# Patient Record
Sex: Female | Born: 1975 | Race: Black or African American | Hispanic: No | Smoking: Current every day smoker
Health system: Southern US, Community
[De-identification: ages and names within clinical notes are randomized; demographics above are authoritative.]

## PROBLEM LIST (undated history)

## (undated) DIAGNOSIS — N83519 Torsion of ovary and ovarian pedicle, unspecified side: Secondary | ICD-10-CM

## (undated) DIAGNOSIS — K5792 Diverticulitis of intestine, part unspecified, without perforation or abscess without bleeding: Secondary | ICD-10-CM

## (undated) HISTORY — PX: OVARIAN CYST REMOVAL: SHX89

---

## 2018-06-03 ENCOUNTER — Emergency Department (HOSPITAL_COMMUNITY)
Admission: EM | Admit: 2018-06-03 | Discharge: 2018-06-03 | Disposition: A | Discharge status: Home / Self Care | Payer: Self-pay | Attending: Emergency Medicine | Admitting: Emergency Medicine

## 2018-06-03 ENCOUNTER — Other Ambulatory Visit: Payer: Self-pay

## 2018-06-03 DIAGNOSIS — K0889 Other specified disorders of teeth and supporting structures: Secondary | ICD-10-CM | POA: Insufficient documentation

## 2018-06-03 MED ORDER — PENICILLIN V POTASSIUM 500 MG PO TABS: 500.0000 mg | ORAL_TABLET | Freq: Four times a day (QID) | ORAL | 0 refills | Status: AC

## 2018-06-03 NOTE — Discharge Instructions (Addendum)
Please read attached information. If you experience any new or worsening signs or symptoms please return to the emergency room for evaluation. Please follow-up with your primary care provider or specialist as discussed. Please use medication prescribed only as directed and discontinue taking if you have any concerning signs or symptoms.   °

## 2018-06-03 NOTE — ED Notes (Signed)
Patient verbalizes understanding of discharge instructions . Opportunity for questions and answers were provided . Armband removed by staff ,Pt discharged from ED. W/C  offered at D/C  and Declined W/C at D/C and was escorted to lobby by RN.  

## 2018-06-03 NOTE — ED Provider Notes (Signed)
[facility] EMERGENCY DEPARTMENT Provider Note   CSN: [id number] Arrival date & time: 06/03/18  1245    History   Chief Complaint Chief Complaint  Patient presents with  . Facial Swelling    HPI Carla Scott is a 43 y.o. female.     HPI   43 year old female presents today with several days of right-sided facial pain.  Patient notes symptoms started over a year ago and have been worsening.  She notes over the last several days subjective swelling.  She cannot tell if this is coming from the top of the bottom tooth.  She denies any fever.  She notes she is new to the area and does not have a dentist.  No past medical history on file.  There are no active problems to display for this patient.    The histories are not reviewed yet. Please review them in the "History" navigator section and refresh this [place].   OB History   No obstetric history on file.      Home Medications    Prior to Admission medications   Medication Sig Start Date End Date Taking? Authorizing Provider  penicillin v potassium (VEETID) 500 MG tablet Take 1 tablet (500 mg total) by mouth 4 (four) times daily for 7 days. 06/03/18 06/10/18  Okey Regal, PA-C    Family History No family history on file.  Social History Social History   Tobacco Use  . Smoking status: Not on file  Substance Use Topics  . Alcohol use: Not on file  . Drug use: Not on file     Allergies   Patient has no known allergies.   Review of Systems Review of Systems  All other systems reviewed and are negative.   Physical Exam Updated Vital Signs BP (!) 166/101 (BP Location: Left Arm)   Pulse 77   Temp 98.4 F (36.9 C) (Oral)   Resp 18   LMP 05/24/2018 (Exact Date)   SpO2 100%   Physical Exam Vitals signs and nursing note reviewed.  Constitutional:      Appearance: She is well-developed.  HENT:     Head: Normocephalic and atraumatic.     Comments: Face is symmetrical no swelling  or edema-gumline palpated with minor tenderness along the lateral aspect, no swelling or fluctuance, no lymphadenopathy uvula midline rises with phonation Eyes:     General: No scleral icterus.       Right eye: No discharge.        Left eye: No discharge.     Conjunctiva/sclera: Conjunctivae normal.     Pupils: Pupils are equal, round, and reactive to light.  Neck:     Musculoskeletal: Normal range of motion.     Vascular: No JVD.     Trachea: No tracheal deviation.  Pulmonary:     Effort: Pulmonary effort is normal.     Breath sounds: No stridor.  Neurological:     Mental Status: She is alert and oriented to person, place, and time.     Coordination: Coordination normal.  Psychiatric:        Behavior: Behavior normal.        Thought Content: Thought content normal.        Judgment: Judgment normal.      ED Treatments / Results  Labs (all labs ordered are listed, but only abnormal results are displayed) Labs Reviewed - No data to display  EKG None  Radiology No results found.  Procedures Procedures (including critical care  time)  Medications Ordered in ED Medications - No data to display   Initial Impression / Assessment and Plan / ED Course  I have reviewed the triage vital signs and the nursing notes.  Pertinent labs & imaging results that were available during my care of the patient were reviewed by me and considered in my medical decision making (see chart for details).        43 year old female presents today with dental pain.  She has no signs of objective swelling.  Patient will be for presumed infection.  Discharged with outpatient follow-up and return precautions.  She verbalized understanding and agreement to today's plan.  Final Clinical Impressions(s) / ED Diagnoses   Final diagnoses:  Pain, dental    ED Discharge Orders         Ordered    penicillin v potassium (VEETID) 500 MG tablet  4 times daily     06/03/18 1337           Okey Regal, PA-C 06/03/18 1515    Drenda Freeze, MD 06/03/18 [phone]

## 2019-11-27 ENCOUNTER — Other Ambulatory Visit: Payer: Self-pay

## 2019-11-27 ENCOUNTER — Encounter (HOSPITAL_COMMUNITY): Payer: Self-pay | Admitting: *Deleted

## 2019-11-27 ENCOUNTER — Emergency Department (HOSPITAL_COMMUNITY)
Admission: EM | Admit: 2019-11-27 | Discharge: 2019-11-27 | Disposition: A | Discharge status: Home / Self Care | Payer: Self-pay | Attending: Emergency Medicine | Admitting: Emergency Medicine

## 2019-11-27 ENCOUNTER — Emergency Department (HOSPITAL_COMMUNITY): Payer: Self-pay

## 2019-11-27 DIAGNOSIS — Z5321 Procedure and treatment not carried out due to patient leaving prior to being seen by health care provider: Secondary | ICD-10-CM | POA: Insufficient documentation

## 2019-11-27 DIAGNOSIS — R110 Nausea: Secondary | ICD-10-CM | POA: Insufficient documentation

## 2019-11-27 DIAGNOSIS — R079 Chest pain, unspecified: Secondary | ICD-10-CM | POA: Insufficient documentation

## 2019-11-27 LAB — CBC
HCT: 39.3 % (ref 36.0–46.0)
Hemoglobin: 12.5 g/dL (ref 12.0–15.0)
MCH: 30.6 pg (ref 26.0–34.0)
MCHC: 31.8 g/dL (ref 30.0–36.0)
MCV: 96.3 fL (ref 80.0–100.0)
Platelets: 202 10*3/uL (ref 150–400)
RBC: 4.08 MIL/uL (ref 3.87–5.11)
RDW: 13.4 % (ref 11.5–15.5)
WBC: 9.2 10*3/uL (ref 4.0–10.5)
nRBC: 0 % (ref 0.0–0.2)

## 2019-11-27 LAB — I-STAT BETA HCG BLOOD, ED (MC, WL, AP ONLY): I-stat hCG, quantitative: <5 m[IU]/mL (ref <5)

## 2019-11-27 LAB — BASIC METABOLIC PANEL
Anion gap: 6 (ref 5–15)
BUN: 12 mg/dL (ref 6–20)
CO2: 25 mmol/L (ref 22–32)
Calcium: 9.3 mg/dL (ref 8.9–10.3)
Chloride: 105 mmol/L (ref 98–111)
Creatinine, Ser: 1.08 mg/dL — ABNORMAL HIGH (ref 0.44–1.00)
GFR calc Af Amer: >60 mL/min (ref >60)
GFR calc non Af Amer: >60 mL/min (ref >60)
Glucose, Bld: 110 mg/dL — ABNORMAL HIGH (ref 70–99)
Potassium: 3.6 mmol/L (ref 3.5–5.1)
Sodium: 136 mmol/L (ref 135–145)

## 2019-11-27 LAB — TROPONIN I (HIGH SENSITIVITY): Troponin I (High Sensitivity): 8 ng/L (ref <18)

## 2019-11-27 NOTE — ED Triage Notes (Signed)
Pt arrived by [facility] for chest pain. Had initial episode at 0630 which resolved. Then had 2nd episode which lasted longer. Per ems, pt was nauseated and appeared anxious, hx of anxiety. Pt received nitro, ASA and zofran pta and pain relieved pta.

## 2019-11-27 NOTE — ED Notes (Signed)
Pt states since she feels better she is going to leave. She stated a plan to follow up with her primary care physician when she can get an appointment. This NT removed the patient's IV, wristband, and stickers. Pt walked outside to call her ride.

## 2020-07-21 ENCOUNTER — Emergency Department (HOSPITAL_COMMUNITY): Payer: Self-pay

## 2020-07-21 ENCOUNTER — Emergency Department (HOSPITAL_COMMUNITY)
Admission: EM | Admit: 2020-07-21 | Discharge: 2020-07-21 | Disposition: A | Discharge status: Home / Self Care | Payer: Self-pay | Attending: Emergency Medicine | Admitting: Emergency Medicine

## 2020-07-21 ENCOUNTER — Other Ambulatory Visit: Payer: Self-pay

## 2020-07-21 DIAGNOSIS — G245 Blepharospasm: Secondary | ICD-10-CM | POA: Insufficient documentation

## 2020-07-21 DIAGNOSIS — H539 Unspecified visual disturbance: Secondary | ICD-10-CM

## 2020-07-21 DIAGNOSIS — H5462 Unqualified visual loss, left eye, normal vision right eye: Secondary | ICD-10-CM | POA: Insufficient documentation

## 2020-07-21 DIAGNOSIS — R519 Headache, unspecified: Secondary | ICD-10-CM | POA: Insufficient documentation

## 2020-07-21 LAB — BASIC METABOLIC PANEL
Anion gap: 9 (ref 5–15)
BUN: 8 mg/dL (ref 6–20)
CO2: 25 mmol/L (ref 22–32)
Calcium: 9.3 mg/dL (ref 8.9–10.3)
Chloride: 102 mmol/L (ref 98–111)
Creatinine, Ser: 1.04 mg/dL — ABNORMAL HIGH (ref 0.44–1.00)
GFR, Estimated: >60 mL/min (ref >60)
Glucose, Bld: 94 mg/dL (ref 70–99)
Potassium: 3.9 mmol/L (ref 3.5–5.1)
Sodium: 136 mmol/L (ref 135–145)

## 2020-07-21 LAB — CBC WITH DIFFERENTIAL/PLATELET
Abs Immature Granulocytes: 0.07 10*3/uL (ref 0.00–0.07)
Basophils Absolute: 0.1 10*3/uL (ref 0.0–0.1)
Basophils Relative: 1 %
Eosinophils Absolute: 0.1 10*3/uL (ref 0.0–0.5)
Eosinophils Relative: 1 %
HCT: 39 % (ref 36.0–46.0)
Hemoglobin: 12.7 g/dL (ref 12.0–15.0)
Immature Granulocytes: 1 %
Lymphocytes Relative: 32 %
Lymphs Abs: 3.9 10*3/uL (ref 0.7–4.0)
MCH: 30.8 pg (ref 26.0–34.0)
MCHC: 32.6 g/dL (ref 30.0–36.0)
MCV: 94.7 fL (ref 80.0–100.0)
Monocytes Absolute: 0.7 10*3/uL (ref 0.1–1.0)
Monocytes Relative: 5 %
Neutro Abs: 7.4 10*3/uL (ref 1.7–7.7)
Neutrophils Relative %: 60 %
Platelets: 225 10*3/uL (ref 150–400)
RBC: 4.12 MIL/uL (ref 3.87–5.11)
RDW: 13.2 % (ref 11.5–15.5)
WBC: 12.2 10*3/uL — ABNORMAL HIGH (ref 4.0–10.5)
nRBC: 0 % (ref 0.0–0.2)

## 2020-07-21 LAB — SEDIMENTATION RATE: Sed Rate: 5 mm/hr (ref 0–22)

## 2020-07-21 MED ORDER — DEXAMETHASONE SODIUM PHOSPHATE 10 MG/ML IJ SOLN
10.0000 mg | Freq: Once | INTRAMUSCULAR | Status: AC
  Administered 2020-07-21: 10 mg via INTRAVENOUS
  Filled 2020-07-21: qty 1

## 2020-07-21 MED ORDER — GADOBUTROL 1 MMOL/ML IV SOLN
9.0000 mL | Freq: Once | INTRAVENOUS | Status: AC | PRN
  Administered 2020-07-21: 9 mL via INTRAVENOUS

## 2020-07-21 MED ORDER — HYDROCHLOROTHIAZIDE 25 MG PO TABS: 25.0000 mg | ORAL_TABLET | Freq: Every day | ORAL | 0 refills | Status: DC

## 2020-07-21 MED ORDER — PROCHLORPERAZINE EDISYLATE 10 MG/2ML IJ SOLN
10.0000 mg | Freq: Once | INTRAMUSCULAR | Status: AC
  Administered 2020-07-21: 10 mg via INTRAVENOUS
  Filled 2020-07-21: qty 2

## 2020-07-21 MED ORDER — SODIUM CHLORIDE 0.9 % IV BOLUS: 1000.0000 mL | Freq: Once | INTRAVENOUS | Status: DC

## 2020-07-21 MED ORDER — KETOROLAC TROMETHAMINE 15 MG/ML IJ SOLN
15.0000 mg | Freq: Once | INTRAMUSCULAR | Status: AC
  Administered 2020-07-21: 15 mg via INTRAVENOUS
  Filled 2020-07-21: qty 1

## 2020-07-21 NOTE — ED Triage Notes (Signed)
Pt here from home with c/o headache  Times 8 days , pt has been out of her meds b/p meds and Zoloft , negative Covid test 5 days ago

## 2020-07-21 NOTE — Discharge Instructions (Signed)
As discussed, your evaluation today has been largely reassuring.  But, it is important that you monitor your condition carefully, and do not hesitate to return to the ED if you develop new, or concerning changes in your condition. ? ?Otherwise, please follow-up with your physician for appropriate ongoing care. ? ?

## 2020-07-21 NOTE — ED Triage Notes (Signed)
Emergency Medicine Provider Triage Evaluation Note  Carla Scott , a 45 y.o. female  was evaluated in triage.  Pt complains of headache.  Began 1 week ago.  Was initially located right side of her head.  Patient states her right side of her head pain has resolved and is now again left pain.  Goes from the posterior aspect of her head by her temporal region and over her left eye.  She denies any vision changes, paresthesias, weakness, head trauma.  Does have history of migraines.  Her migraines previously have been in today.  Review of Systems  Positive: Headache Negative: Vision changes, weakness, numbness  Physical Exam  BP (!) 147/97 (BP Location: Left Arm)   Pulse 89   Temp 98.9 F (37.2 C) (Oral)   Resp 14   SpO2 100%  Gen:   Awake, no distress   HEENT:  Atraumatic  Resp:  Normal effort  Cardiac:  Normal rate  Abd:   Nondistended, nontender  MSK:   Moves extremities without difficulty  Neuro:  Speech clear, no facial droop, equal strength bilaterally, intact sensation  Medical Decision Making  Medically screening exam initiated at 7:54 AM.  Appropriate orders placed.  Clela Katayama was informed that the remainder of the evaluation will be completed by another provider, this initial triage assessment does not replace that evaluation, and the importance of remaining in the ED until their evaluation is complete.  Clinical Impression  Stable  Headache   Karalyne Nusser A, PA-C 07/21/20 0755

## 2020-07-21 NOTE — ED Provider Notes (Signed)
[facility] EMERGENCY DEPARTMENT Provider Note   CSN: [id number] Arrival date & time: 07/21/20  7829     History No chief complaint on file.   Carla Scott is a 45 y.o. female.  HPI Patient presents with her headache, intermittent vision loss. She does have a history of migraines, but notes that today's headache is unusual for her.  This is actually been present for about 8 days.  Pain began initially on the right side, but is subsequently moved to the left side and is now left parietal, left occipital and less so less frontal.  Sore, severe, persistent. There is associated new left blepharospasm and intermittent left eye vision loss, though none of this is present currently. No weakness in any extremity, no vomiting. No relief with OTC medication.     No past medical history on file.  There are no problems to display for this patient.   No past surgical history on file.   OB History   No obstetric history on file.     No family history on file.     Home Medications Prior to Admission medications   Medication Sig Start Date End Date Taking? Authorizing Provider  hydrochlorothiazide (HYDRODIURIL) 25 MG tablet Take 1 tablet (25 mg total) by mouth daily. 07/21/20  Yes Carmin Muskrat, MD    Allergies    Patient has no known allergies.  Review of Systems   Review of Systems  Constitutional:       Per HPI, otherwise negative  HENT:       Per HPI, otherwise negative  Eyes: Positive for pain and visual disturbance.       Pain with lateral vision left eye only.  Respiratory:       Per HPI, otherwise negative  Cardiovascular:       Per HPI, otherwise negative  Gastrointestinal: Negative for vomiting.  Endocrine:       Negative aside from HPI  Genitourinary:       Neg aside from HPI   Musculoskeletal:       Per HPI, otherwise negative  Skin: Negative.   Neurological: Positive for headaches. Negative for syncope.    Physical  Exam Updated Vital Signs BP (!) 158/95   Pulse 83   Temp 98.9 F (37.2 C) (Oral)   Resp 18   SpO2 100%   Physical Exam Vitals and nursing note reviewed.  Constitutional:      General: She is not in acute distress.    Appearance: She is well-developed.  HENT:     Head: Normocephalic and atraumatic.  Eyes:     Conjunctiva/sclera: Conjunctivae normal.     Comments: Ongoing left eye blepharospasm.  Pain reported with left eye visual deviation.  Cardiovascular:     Rate and Rhythm: Normal rate and regular rhythm.  Pulmonary:     Effort: Pulmonary effort is normal. No respiratory distress.     Breath sounds: Normal breath sounds. No stridor.  Abdominal:     General: There is no distension.  Skin:    General: Skin is warm and dry.  Neurological:     Mental Status: She is alert and oriented to person, place, and time.     Cranial Nerves: No cranial nerve deficit.     Coordination: Coordination normal.     Comments: No visual acuity changes currently, no field cuts.  Face is symmetric, speech is clear.     ED Results / Procedures / Treatments   Labs (all labs  ordered are listed, but only abnormal results are displayed) Labs Reviewed  BASIC METABOLIC PANEL - Abnormal; Notable for the following components:      Result Value   Creatinine, Ser 1.04 (*)    All other components within normal limits  CBC WITH DIFFERENTIAL/PLATELET - Abnormal; Notable for the following components:   WBC 12.2 (*)    All other components within normal limits  SEDIMENTATION RATE    EKG None  Radiology MR Brain W and Wo Contrast  Result Date: 07/21/2020 CLINICAL DATA:  Left-sided headache and intermittent vision loss. Blepharospasm. EXAM: MRI HEAD WITHOUT AND WITH CONTRAST TECHNIQUE: Multiplanar, multiecho pulse sequences of the brain and surrounding structures were obtained without and with intravenous contrast. CONTRAST:  53mL GADAVIST GADOBUTROL 1 MMOL/ML IV SOLN COMPARISON:  None. FINDINGS:  Brain: There is no evidence of an acute infarct, intracranial hemorrhage, mass, midline shift, or extra-axial fluid collection. The ventricles and sulci are normal. There is a prominently enlarged partially empty sella. The cerebellar tonsils are normally positioned. There are 15-20 small foci of T2 FLAIR hyperintensity scattered in the subcortical, deep, and periventricular cerebral white matter bilaterally with the largest measuring 7 mm adjacent to the right frontal horn. Vascular: Major intracranial vascular flow voids are preserved. The major dural venous sinuses are enhancing. Skull and upper cervical spine: Focal nonspecific thickening of the parietal scalp soft tissues in the midline near the vertex. No enhancing scalp mass. No suspicious marrow lesion. Sinuses/Orbits: Unremarkable orbits. Large mucous retention cyst in the right maxillary sinus. Clear mastoid air cells. Other: None. IMPRESSION: 1. No acute intracranial abnormality. 2. Cerebral white matter T2 signal changes which are mild but advanced for age, nonspecific though may reflect premature chronic small vessel ischemia, migraines, or prior infection/inflammation. 3. Partially empty sella, often an incidental finding though can be seen with idiopathic intracranial hypertension. Electronically Signed   By: Logan Bores M.D.   On: 07/21/2020 12:55    Procedures Procedures   Medications Ordered in ED Medications  sodium chloride 0.9 % bolus 1,000 mL (has no administration in time range)  prochlorperazine (COMPAZINE) injection 10 mg (10 mg Intravenous Given 07/21/20 1200)  ketorolac (TORADOL) 15 MG/ML injection 15 mg (15 mg Intravenous Given 07/21/20 1159)  dexamethasone (DECADRON) injection 10 mg (10 mg Intravenous Given 07/21/20 1158)  gadobutrol (GADAVIST) 1 MMOL/ML injection 9 mL (9 mLs Intravenous Contrast Given 07/21/20 1234)    ED Course  I have reviewed the triage vital signs and the nursing notes.  Pertinent labs & imaging  results that were available during my care of the patient were reviewed by me and considered in my medical decision making (see chart for details).  1:28 PM On repeat exam the patient's headache is resolved.  I reviewed her labs, MRI, now discussed them with her. No evidence for intracranial mass, stroke though there are findings consistent with a long history of hypertension.  Patient is aware of this, need for medication compliance, and she will be provided a refill of her hydrochlorothiazide, referral to follow-up with our local clinic. With improved headache here, suspicion for atypical migraine contributing to her presentation.  Patient discharged in stable condition. Final Clinical Impression(s) / ED Diagnoses Final diagnoses:  Vision changes  Bad headache    Rx / DC Orders ED Discharge Orders         Ordered    hydrochlorothiazide (HYDRODIURIL) 25 MG tablet  Daily        07/21/20 1322  Carmin Muskrat, MD 07/21/20 1329

## 2020-07-21 NOTE — ED Notes (Signed)
Patient transported to MRI 

## 2020-11-21 ENCOUNTER — Emergency Department (HOSPITAL_BASED_OUTPATIENT_CLINIC_OR_DEPARTMENT_OTHER): Payer: Self-pay

## 2020-11-21 ENCOUNTER — Encounter (HOSPITAL_BASED_OUTPATIENT_CLINIC_OR_DEPARTMENT_OTHER): Payer: Self-pay | Admitting: Obstetrics and Gynecology

## 2020-11-21 ENCOUNTER — Inpatient Hospital Stay (HOSPITAL_BASED_OUTPATIENT_CLINIC_OR_DEPARTMENT_OTHER)
Admission: EM | Admit: 2020-11-21 | Discharge: 2020-11-24 | DRG: 392 | Disposition: A | Discharge status: Home / Self Care | Payer: Self-pay | Attending: Surgery | Admitting: Surgery

## 2020-11-21 ENCOUNTER — Other Ambulatory Visit: Payer: Self-pay

## 2020-11-21 DIAGNOSIS — K5720 Diverticulitis of large intestine with perforation and abscess without bleeding: Principal | ICD-10-CM | POA: Diagnosis present

## 2020-11-21 DIAGNOSIS — I10 Essential (primary) hypertension: Secondary | ICD-10-CM | POA: Diagnosis present

## 2020-11-21 DIAGNOSIS — E663 Overweight: Secondary | ICD-10-CM | POA: Diagnosis present

## 2020-11-21 DIAGNOSIS — N83202 Unspecified ovarian cyst, left side: Secondary | ICD-10-CM | POA: Diagnosis present

## 2020-11-21 DIAGNOSIS — F17210 Nicotine dependence, cigarettes, uncomplicated: Secondary | ICD-10-CM | POA: Diagnosis present

## 2020-11-21 DIAGNOSIS — K5732 Diverticulitis of large intestine without perforation or abscess without bleeding: Secondary | ICD-10-CM | POA: Diagnosis present

## 2020-11-21 DIAGNOSIS — Z20822 Contact with and (suspected) exposure to covid-19: Secondary | ICD-10-CM | POA: Diagnosis present

## 2020-11-21 DIAGNOSIS — Z79899 Other long term (current) drug therapy: Secondary | ICD-10-CM

## 2020-11-21 DIAGNOSIS — Z90721 Acquired absence of ovaries, unilateral: Secondary | ICD-10-CM

## 2020-11-21 HISTORY — DX: Torsion of ovary and ovarian pedicle, unspecified side: N83.519

## 2020-11-21 LAB — URINALYSIS, ROUTINE W REFLEX MICROSCOPIC
Bilirubin Urine: NEGATIVE
Glucose, UA: NEGATIVE mg/dL
Ketones, ur: 40 mg/dL — AB
Leukocytes,Ua: NEGATIVE
Nitrite: NEGATIVE
Specific Gravity, Urine: >1.046 — ABNORMAL HIGH (ref 1.005–1.030)
pH: 6 (ref 5.0–8.0)

## 2020-11-21 LAB — CBC
HCT: 37.1 % (ref 36.0–46.0)
Hemoglobin: 12.1 g/dL (ref 12.0–15.0)
MCH: 30.3 pg (ref 26.0–34.0)
MCHC: 32.6 g/dL (ref 30.0–36.0)
MCV: 93 fL (ref 80.0–100.0)
Platelets: 206 10*3/uL (ref 150–400)
RBC: 3.99 MIL/uL (ref 3.87–5.11)
RDW: 13 % (ref 11.5–15.5)
WBC: 13.6 10*3/uL — ABNORMAL HIGH (ref 4.0–10.5)
nRBC: 0 % (ref 0.0–0.2)

## 2020-11-21 LAB — COMPREHENSIVE METABOLIC PANEL
ALT: 8 U/L (ref 0–44)
AST: 9 U/L — ABNORMAL LOW (ref 15–41)
Albumin: 4.5 g/dL (ref 3.5–5.0)
Alkaline Phosphatase: 46 U/L (ref 38–126)
Anion gap: 11 (ref 5–15)
BUN: 10 mg/dL (ref 6–20)
CO2: 21 mmol/L — ABNORMAL LOW (ref 22–32)
Calcium: 9.5 mg/dL (ref 8.9–10.3)
Chloride: 106 mmol/L (ref 98–111)
Creatinine, Ser: 0.95 mg/dL (ref 0.44–1.00)
GFR, Estimated: >60 mL/min (ref >60)
Glucose, Bld: 94 mg/dL (ref 70–99)
Potassium: 3.5 mmol/L (ref 3.5–5.1)
Sodium: 138 mmol/L (ref 135–145)
Total Bilirubin: 0.6 mg/dL (ref 0.3–1.2)
Total Protein: 7.7 g/dL (ref 6.5–8.1)

## 2020-11-21 LAB — RESP PANEL BY RT-PCR (FLU A&B, COVID) ARPGX2
Influenza A by PCR: NEGATIVE
Influenza B by PCR: NEGATIVE
SARS Coronavirus 2 by RT PCR: NEGATIVE

## 2020-11-21 LAB — PREGNANCY, URINE: Preg Test, Ur: NEGATIVE

## 2020-11-21 LAB — LIPASE, BLOOD: Lipase: 17 U/L (ref 11–51)

## 2020-11-21 LAB — LACTIC ACID, PLASMA: Lactic Acid, Venous: 0.8 mmol/L (ref 0.5–1.9)

## 2020-11-21 MED ORDER — SODIUM CHLORIDE 0.9 % IV BOLUS
1000.0000 mL | Freq: Once | INTRAVENOUS | Status: AC
  Administered 2020-11-21: 1000 mL via INTRAVENOUS

## 2020-11-21 MED ORDER — ONDANSETRON HCL 4 MG/2ML IJ SOLN
4.0000 mg | Freq: Once | INTRAMUSCULAR | Status: AC
  Administered 2020-11-21: 4 mg via INTRAVENOUS
  Filled 2020-11-21: qty 2

## 2020-11-21 MED ORDER — HYDROMORPHONE HCL 1 MG/ML IJ SOLN
1.0000 mg | Freq: Once | INTRAMUSCULAR | Status: AC
  Administered 2020-11-21: 1 mg via INTRAVENOUS
  Filled 2020-11-21: qty 1

## 2020-11-21 MED ORDER — IOHEXOL 350 MG/ML SOLN
75.0000 mL | Freq: Once | INTRAVENOUS | Status: AC | PRN
  Administered 2020-11-21: 75 mL via INTRAVENOUS

## 2020-11-21 MED ORDER — IOHEXOL 9 MG/ML PO SOLN
500.0000 mL | Freq: Two times a day (BID) | ORAL | Status: DC | PRN
Start: 2020-11-21 — End: 2020-11-21

## 2020-11-21 MED ORDER — PIPERACILLIN-TAZOBACTAM 3.375 G IVPB 30 MIN
3.3750 g | Freq: Once | INTRAVENOUS | Status: AC
  Administered 2020-11-21: 3.375 g via INTRAVENOUS
  Filled 2020-11-21: qty 50

## 2020-11-21 NOTE — ED Provider Notes (Signed)
[facility] EMERGENCY DEPT Provider Note   CSN: [id number] Arrival date & time: 11/21/20  1844     History Chief Complaint  Patient presents with   Abdominal Pain    Carla Scott is a 45 y.o. female.  She does have a history of diverticulitis which did not require surgical intervention.  Also has a history of ovarian torsion and has had a left oophorectomy.  Symptoms have been progressive for about a month.  She has been losing weight and can barely eat anything without feeling severe pain.  The history is provided by the patient.  Abdominal Pain Pain location:  Generalized Pain quality: cramping   Pain quality comment:  Like contractions Pain radiates to:  Back Pain severity:  Severe Onset quality:  Gradual Duration:  4 weeks Timing:  Constant Progression:  Worsening Chronicity:  New Context comment:  No known cause Relieved by:  Nothing Worsened by:  Eating Ineffective treatments: eating small meals. Associated symptoms: anorexia, constipation and nausea   Associated symptoms: no chest pain, no chills, no cough, no dysuria, no fever, no hematuria, no shortness of breath, no sore throat and no vomiting       Past Medical History:  Diagnosis Date   Ovarian torsion     There are no problems to display for this patient.   Past Surgical History:  Procedure Laterality Date   OVARIAN CYST REMOVAL       OB History     Gravida      Para      Term      Preterm      AB      Living  0      SAB      IAB      Ectopic      Multiple      Live Births  0           No family history on file.  Social History   Tobacco Use   Smoking status: Every Day    Packs/day: 0.50    Years: 29.00    Pack years: 14.50    Types: Cigarettes    Passive exposure: Never   Smokeless tobacco: Never  Vaping Use   Vaping Use: Never used  Substance Use Topics   Alcohol use: Not Currently   Drug use: Yes    Types: Marijuana    Home  Medications Prior to Admission medications   Medication Sig Start Date End Date Taking? Authorizing Provider  hydrochlorothiazide (HYDRODIURIL) 25 MG tablet Take 1 tablet (25 mg total) by mouth daily. 07/21/20   Carmin Muskrat, MD    Allergies    Patient has no known allergies.  Review of Systems   Review of Systems  Constitutional:  Negative for chills and fever.  HENT:  Negative for ear pain and sore throat.   Eyes:  Negative for pain and visual disturbance.  Respiratory:  Negative for cough and shortness of breath.   Cardiovascular:  Negative for chest pain and palpitations.  Gastrointestinal:  Positive for abdominal pain, anorexia, constipation and nausea. Negative for vomiting.  Genitourinary:  Negative for dysuria and hematuria.  Musculoskeletal:  Negative for arthralgias and back pain.  Skin:  Negative for color change and rash.  Neurological:  Negative for seizures and syncope.  All other systems reviewed and are negative.  Physical Exam Updated Vital Signs BP (!) 165/102   Pulse 100   Temp (!) 100.4 F (38 C)   Resp 19  LMP 11/06/2020 (Exact Date)   SpO2 100%   Physical Exam Vitals and nursing note reviewed.  Constitutional:      General: She is in acute distress.  HENT:     Head: Normocephalic and atraumatic.  Cardiovascular:     Rate and Rhythm: Regular rhythm. Tachycardia present.     Heart sounds: Normal heart sounds.  Pulmonary:     Effort: Pulmonary effort is normal. No tachypnea.     Breath sounds: Normal breath sounds.  Abdominal:     General: Bowel sounds are absent. There is distension.     Palpations: Abdomen is soft.     Tenderness: There is generalized abdominal tenderness. There is rebound. There is no guarding.  Musculoskeletal:     Right lower leg: No edema.     Left lower leg: No edema.  Skin:    General: Skin is warm and dry.  Neurological:     General: No focal deficit present.     Mental Status: She is alert and oriented to  person, place, and time.  Psychiatric:        Mood and Affect: Mood normal.        Behavior: Behavior normal.    ED Results / Procedures / Treatments   Labs (all labs ordered are listed, but only abnormal results are displayed) Labs Reviewed  COMPREHENSIVE METABOLIC PANEL - Abnormal; Notable for the following components:      Result Value   CO2 21 (*)    AST 9 (*)    All other components within normal limits  CBC - Abnormal; Notable for the following components:   WBC 13.6 (*)    All other components within normal limits  URINALYSIS, ROUTINE W REFLEX MICROSCOPIC - Abnormal; Notable for the following components:   Specific Gravity, Urine >1.046 (*)    Hgb urine dipstick TRACE (*)    Ketones, ur 40 (*)    Protein, ur TRACE (*)    All other components within normal limits  RESP PANEL BY RT-PCR (FLU A&B, COVID) ARPGX2  CULTURE, BLOOD (ROUTINE X 2)  CULTURE, BLOOD (ROUTINE X 2)  LIPASE, BLOOD  PREGNANCY, URINE  LACTIC ACID, PLASMA    EKG None  Radiology CT Abdomen Pelvis W Contrast  Result Date: 11/21/2020 CLINICAL DATA:  Abdominal pain. EXAM: CT ABDOMEN AND PELVIS WITH CONTRAST TECHNIQUE: Multidetector CT imaging of the abdomen and pelvis was performed using the standard protocol following bolus administration of intravenous contrast. CONTRAST:  36m OMNIPAQUE IOHEXOL 350 MG/ML SOLN COMPARISON:  None. FINDINGS: Lower chest: The visualized lung bases are clear. No intra-abdominal free air.  Small free fluid in the pelvis. Hepatobiliary: No focal liver abnormality is seen. No gallstones, gallbladder wall thickening, or biliary dilatation. Pancreas: Unremarkable. No pancreatic ductal dilatation or surrounding inflammatory changes. Spleen: Normal in size without focal abnormality. Adrenals/Urinary Tract: The left adrenal glands unremarkable. There is a 1 cm indeterminate right adrenal nodule. The kidneys, visualized ureters, and urinary bladder appear unremarkable. Stomach/Bowel: There  is diverticulosis of the distal descending colon with inflammatory changes consistent with acute diverticulitis. No diverticular abscess. Several punctate foci of air along the wall of the colon (55/2 and 64/5) may be extraluminal and represent focally contained micro perforations. There is no bowel obstruction. The appendix is normal. Vascular/Lymphatic: Mild aortoiliac atherosclerotic disease. The IVC is unremarkable no venous gas. There is no adenopathy. Reproductive: The uterus is anteverted and grossly unremarkable. There is a 4.5 cm left ovarian cyst. No follow-up imaging recommended. Note:  This recommendation does not apply to premenarchal patients and to those with increased risk (genetic, family history, elevated tumor markers or other high-risk factors) of ovarian cancer. Reference: JACR 2020 Feb; 17(2):248-254 Other: None Musculoskeletal: No acute or significant osseous findings. IMPRESSION: 1. Acute diverticulitis of the distal descending colon with possible microperforation. No diverticular abscess. 2. A 4.5 cm left ovarian cyst. 3. There is a 1 cm indeterminate right adrenal nodule. 4. Aortic Atherosclerosis (ICD10-I70.0). Electronically Signed   By: Anner Crete M.D.   On: 11/21/2020 21:37    Procedures Procedures   Medications Ordered in ED Medications  sodium chloride 0.9 % bolus 1,000 mL (0 mLs Intravenous Stopped 11/21/20 2158)  HYDROmorphone (DILAUDID) injection 1 mg (1 mg Intravenous Given 11/21/20 2039)  ondansetron (ZOFRAN) injection 4 mg (4 mg Intravenous Given 11/21/20 2039)  iohexol (OMNIPAQUE) 350 MG/ML injection 75 mL (75 mLs Intravenous Contrast Given 11/21/20 2113)  piperacillin-tazobactam (ZOSYN) IVPB 3.375 g (3.375 g Intravenous New Bag/Given 11/21/20 2252)  HYDROmorphone (DILAUDID) injection 1 mg (1 mg Intravenous Given 11/21/20 2309)    ED Course  I have reviewed the triage vital signs and the nursing notes.  Pertinent labs & imaging results that were available  during my care of the patient were reviewed by me and considered in my medical decision making (see chart for details).  Clinical Course as of 11/21/20 2329  Nancy Fetter Nov 21, 2020  2249 I spoke with Dr. Barry Dienes regarding admission.  [AW]    Clinical Course User Index [AW] Arnaldo Natal, MD   MDM Rules/Calculators/A&P                           Garald Braver resented with severe abdominal pain.  I was immediately concerned for peritonitis given her fever and exam.  ED evaluation revealed a mild elevation in white count and diverticulitis with possible microperforation.  She was given pain medications and antibiotics and will be admitted to the hospital for further treatment. Final Clinical Impression(s) / ED Diagnoses Final diagnoses:  Diverticulitis of large intestine with perforation without bleeding    Rx / DC Orders ED Discharge Orders     None        Arnaldo Natal, MD 11/21/20 2329

## 2020-11-21 NOTE — H&P (Signed)
Carla Scott is an 45 y.o. female.   Chief Complaint: Diverticulitis HPI:  Pt is a 45 yo F who presented to [facility] 11/21/20 with worsening abdominal pain. Patient reports intermittent cramping pain for the last 2 months that has progressively worsened. Pain was significantly worse over the last 24 hrs and patient was unable to stand up straight. She decided to come to the ED for evaluation. She has had a history of diverticulitis in 2017 in [place] but did not require admission (somewhere with residents). She reports subjective fevers. She has had bloating and nausea. BMs over the last few days have been very small. No blood in BMs. She is passing flatus. She denies urinary symptoms. She has never undergone colonoscopy.   PMH otherwise significant for HTN and ovarian torsion. Past abdominal surgery includes removal of an ovarian cyst - patient thought she'd had a left oophrectomy but CT reviewed with radiology and left ovarian cyst confirmed. No blood thinning medications. NKDA.   Past Medical History:  Diagnosis Date   Ovarian torsion     Past Surgical History:  Procedure Laterality Date   OVARIAN CYST REMOVAL      No family history on file. Social History:  reports that she has been smoking cigarettes. She has a 14.50 pack-year smoking history. She has never been exposed to tobacco smoke. She has never used smokeless tobacco. She reports that she does not currently use alcohol. She reports current drug use. Drug: Marijuana.  Allergies: No Known Allergies  Meds: HCTZ  Results for orders placed or performed during the hospital encounter of 11/21/20 (from the past 48 hour(s))  Lipase, blood     Status: None   Collection Time: 11/21/20  7:54 PM  Result Value Ref Range   Lipase 17 11 - 51 U/L    Comment: Performed at [facility], [address] [place], [place] [address]  Comprehensive metabolic panel     Status: Abnormal   Collection Time:  11/21/20  7:54 PM  Result Value Ref Range   Sodium 138 135 - 145 mmol/L   Potassium 3.5 3.5 - 5.1 mmol/L   Chloride 106 98 - 111 mmol/L   CO2 21 (L) 22 - 32 mmol/L   Glucose, Bld 94 70 - 99 mg/dL    Comment: Glucose reference range applies only to samples taken after fasting for at least 8 hours.   BUN 10 6 - 20 mg/dL   Creatinine, Ser 0.95 0.44 - 1.00 mg/dL   Calcium 9.5 8.9 - 10.3 mg/dL   Total Protein 7.7 6.5 - 8.1 g/dL   Albumin 4.5 3.5 - 5.0 g/dL   AST 9 (L) 15 - 41 U/L   ALT 8 0 - 44 U/L   Alkaline Phosphatase 46 38 - 126 U/L   Total Bilirubin 0.6 0.3 - 1.2 mg/dL   GFR, Estimated >60 >60 mL/min    Comment: (NOTE) Calculated using the CKD-EPI Creatinine Equation (2021)    Anion gap 11 5 - 15    Comment: Performed at [facility], [place], [place], [place] [address]  CBC     Status: Abnormal   Collection Time: 11/21/20  7:54 PM  Result Value Ref Range   WBC 13.6 (H) 4.0 - 10.5 K/uL   RBC 3.99 3.87 - 5.11 MIL/uL   Hemoglobin 12.1 12.0 - 15.0 g/dL   HCT 37.1 36.0 - 46.0 %   MCV 93.0 80.0 - 100.0 fL   MCH 30.3 26.0 - 34.0  pg   MCHC 32.6 30.0 - 36.0 g/dL   RDW 13.0 11.5 - 15.5 %   Platelets 206 150 - 400 K/uL   nRBC 0.0 0.0 - 0.2 %    Comment: Performed at [facility], [address] [place], [place] [address]  Resp Panel by RT-PCR (Flu A&B, Covid) Nasopharyngeal Swab     Status: None   Collection Time: 11/21/20  7:54 PM   Specimen: Nasopharyngeal Swab; Nasopharyngeal(NP) swabs in vial transport medium  Result Value Ref Range   SARS Coronavirus 2 by RT PCR NEGATIVE NEGATIVE    Comment: (NOTE) SARS-CoV-2 target nucleic acids are NOT DETECTED.  The SARS-CoV-2 RNA is generally detectable in upper respiratory specimens during the acute phase of infection. The lowest concentration of SARS-CoV-2 viral copies this assay can detect is 138 copies/mL. A negative result does not preclude SARS-Cov-2 infection and should not be used  as the sole basis for treatment or other patient management decisions. A negative result may occur with  improper specimen collection/handling, submission of specimen other than nasopharyngeal swab, presence of viral mutation(s) within the areas targeted by this assay, and inadequate number of viral copies(<138 copies/mL). A negative result must be combined with clinical observations, patient history, and epidemiological information. The expected result is Negative.  Fact Sheet for Patients:  EntrepreneurPulse.com.au  Fact Sheet for Healthcare Providers:  IncredibleEmployment.be  This test is no t yet approved or cleared by the [place] FDA and  has been authorized for detection and/or diagnosis of SARS-CoV-2 by FDA under an Emergency Use Authorization (EUA). This EUA will remain  in effect (meaning this test can be used) for the duration of the COVID-19 declaration under Section 564(b)(1) of the Act, 21 U.S.C.section 360bbb-3(b)(1), unless the authorization is terminated  or revoked sooner.       Influenza A by PCR NEGATIVE NEGATIVE   Influenza B by PCR NEGATIVE NEGATIVE    Comment: (NOTE) The Xpert Xpress SARS-CoV-2/FLU/RSV plus assay is intended as an aid in the diagnosis of influenza from Nasopharyngeal swab specimens and should not be used as a sole basis for treatment. Nasal washings and aspirates are unacceptable for Xpert Xpress SARS-CoV-2/FLU/RSV testing.  Fact Sheet for Patients: EntrepreneurPulse.com.au  Fact Sheet for Healthcare Providers: IncredibleEmployment.be  This test is not yet approved or cleared by the [place] FDA and has been authorized for detection and/or diagnosis of SARS-CoV-2 by FDA under an Emergency Use Authorization (EUA). This EUA will remain in effect (meaning this test can be used) for the duration of the COVID-19 declaration under Section 564(b)(1) of the Act,  21 U.S.C. section 360bbb-3(b)(1), unless the authorization is terminated or revoked.  Performed at [facility], [address], [place], [place] [address]   Lactic acid, plasma     Status: None   Collection Time: 11/21/20  8:40 PM  Result Value Ref Range   Lactic Acid, Venous 0.8 0.5 - 1.9 mmol/L    Comment: Performed at [facility], [address], [place], [place] [address]  Urinalysis, Routine w reflex microscopic Urine, Clean Catch     Status: Abnormal   Collection Time: 11/21/20  9:43 PM  Result Value Ref Range   Color, Urine YELLOW YELLOW   APPearance CLEAR CLEAR   Specific Gravity, Urine >1.046 (H) 1.005 - 1.030   pH 6.0 5.0 - 8.0   Glucose, UA NEGATIVE NEGATIVE mg/dL   Hgb urine dipstick TRACE (A) NEGATIVE   Bilirubin Urine NEGATIVE NEGATIVE   Ketones,  ur 40 (A) NEGATIVE mg/dL   Protein, ur TRACE (A) NEGATIVE mg/dL   Nitrite NEGATIVE NEGATIVE   Leukocytes,Ua NEGATIVE NEGATIVE   RBC / HPF 6-10 0 - 5 RBC/hpf   WBC, UA 0-5 0 - 5 WBC/hpf   Squamous Epithelial / LPF 0-5 0 - 5   Mucus PRESENT     Comment: Performed at [facility], [address], [place], [place] [address]  Pregnancy, urine     Status: None   Collection Time: 11/21/20  9:43 PM  Result Value Ref Range   Preg Test, Ur NEGATIVE NEGATIVE    Comment: Performed at [facility], [address] [place], [place] [address]   CT Abdomen Pelvis W Contrast  Result Date: 11/21/2020 CLINICAL DATA:  Abdominal pain. EXAM: CT ABDOMEN AND PELVIS WITH CONTRAST TECHNIQUE: Multidetector CT imaging of the abdomen and pelvis was performed using the standard protocol following bolus administration of intravenous contrast. CONTRAST:  48m OMNIPAQUE IOHEXOL 350 MG/ML SOLN COMPARISON:  None. FINDINGS: Lower chest: The visualized lung bases are clear. No intra-abdominal free air.  Small free fluid in the pelvis. Hepatobiliary: No focal liver abnormality  is seen. No gallstones, gallbladder wall thickening, or biliary dilatation. Pancreas: Unremarkable. No pancreatic ductal dilatation or surrounding inflammatory changes. Spleen: Normal in size without focal abnormality. Adrenals/Urinary Tract: The left adrenal glands unremarkable. There is a 1 cm indeterminate right adrenal nodule. The kidneys, visualized ureters, and urinary bladder appear unremarkable. Stomach/Bowel: There is diverticulosis of the distal descending colon with inflammatory changes consistent with acute diverticulitis. No diverticular abscess. Several punctate foci of air along the wall of the colon (55/2 and 64/5) may be extraluminal and represent focally contained micro perforations. There is no bowel obstruction. The appendix is normal. Vascular/Lymphatic: Mild aortoiliac atherosclerotic disease. The IVC is unremarkable no venous gas. There is no adenopathy. Reproductive: The uterus is anteverted and grossly unremarkable. There is a 4.5 cm left ovarian cyst. No follow-up imaging recommended. Note: This recommendation does not apply to premenarchal patients and to those with increased risk (genetic, family history, elevated tumor markers or other high-risk factors) of ovarian cancer. Reference: JACR 2020 Feb; 17(2):248-254 Other: None Musculoskeletal: No acute or significant osseous findings. IMPRESSION: 1. Acute diverticulitis of the distal descending colon with possible microperforation. No diverticular abscess. 2. A 4.5 cm left ovarian cyst. 3. There is a 1 cm indeterminate right adrenal nodule. 4. Aortic Atherosclerosis (ICD10-I70.0). Electronically Signed   By: AAnner CreteM.D.   On: 11/21/2020 21:37    Review of Systems  Constitutional:  Positive for diaphoresis and fever.  Respiratory:  Negative for shortness of breath and wheezing.   Cardiovascular:  Negative for chest pain and palpitations.  Gastrointestinal:  Positive for abdominal pain and nausea. Negative for blood in stool,  constipation and vomiting.  Genitourinary:  Negative for dysuria, vaginal bleeding and vaginal discharge.  All other systems reviewed and are negative.   Blood pressure (!) 155/91, pulse (!) 111, temperature 99.2 F (37.3 C), temperature source Oral, resp. rate 14, last menstrual period 11/06/2020, SpO2 100 %. General: pleasant, WD, overweight female who is laying in bed in NAD HEENT: head is normocephalic, atraumatic.  Sclera are noninjected.  PERRL.  Ears and nose without any masses or lesions.  Mouth is pink and moist Heart: regular, rate, and rhythm.  Normal s1,s2. No obvious murmurs, gallops, or rubs noted.  Palpable radial and pedal pulses bilaterally Lungs: CTAB, no wheezes, rhonchi, or rales noted.  Respiratory effort nonlabored  Abd: soft, diffusely TTP but worse in LLQ without peritonitis, ND, +BS, no masses, hernias, or organomegaly MS: all 4 extremities are symmetrical with no cyanosis, clubbing, or edema. Skin: warm and dry with no masses, lesions, or rashes Neuro: Cranial nerves 2-12 grossly intact, sensation is normal throughout Psych: A&Ox3 with an appropriate affect.   Assessment/Plan Acute diverticulitis of descending colon with possible microperforation - CT yesterday showed acute diverticulitis with possible microperforation without abscess - WBC 16.3, VSS - abdominal exam with significant TTP in LLQ but no overt peritonitis - no indication for emergent surgical intervention at this time. - admit to inpatient for IV abx and bowel rest - ok to have ice chips tonight - repeat labs in AM - hopefully we can get patient over this episode without surgical intervention. Would need colonoscopy in 6-8 weeks from discharge.   FEN: ice chips, IVF '@100'$  cc/h VTE: SCDs, LMWH ID: zosyn 8/28>>  HTN - home meds L ovarian cyst - OP follow up with GYN, confirmed with radiology 1 cm indeterminate R adrenal nodule - recommend OP follow up   Norm Parcel, [facility]  Surgery 11/22/2020, 1:08 PM Please see Amion for pager number during day hours 7:00am-4:30pm

## 2020-11-21 NOTE — ED Triage Notes (Signed)
Patient reports everything in her abdomen hurts. Patient reports she is tender to touch, sensitive to movement, even her own breasts touching her stomach cause her pain. Patient reports difficulty eating and nausea. Patient reports limited to no Bms recently

## 2020-11-22 LAB — BASIC METABOLIC PANEL
Anion gap: 8 (ref 5–15)
BUN: 7 mg/dL (ref 6–20)
CO2: 24 mmol/L (ref 22–32)
Calcium: 8.9 mg/dL (ref 8.9–10.3)
Chloride: 105 mmol/L (ref 98–111)
Creatinine, Ser: 0.96 mg/dL (ref 0.44–1.00)
GFR, Estimated: >60 mL/min (ref >60)
Glucose, Bld: 108 mg/dL — ABNORMAL HIGH (ref 70–99)
Potassium: 3.6 mmol/L (ref 3.5–5.1)
Sodium: 137 mmol/L (ref 135–145)

## 2020-11-22 LAB — CBC
HCT: 32.7 % — ABNORMAL LOW (ref 36.0–46.0)
Hemoglobin: 10.7 g/dL — ABNORMAL LOW (ref 12.0–15.0)
MCH: 30.5 pg (ref 26.0–34.0)
MCHC: 32.7 g/dL (ref 30.0–36.0)
MCV: 93.2 fL (ref 80.0–100.0)
Platelets: 182 10*3/uL (ref 150–400)
RBC: 3.51 MIL/uL — ABNORMAL LOW (ref 3.87–5.11)
RDW: 12.9 % (ref 11.5–15.5)
WBC: 16.3 10*3/uL — ABNORMAL HIGH (ref 4.0–10.5)
nRBC: 0 % (ref 0.0–0.2)

## 2020-11-22 LAB — HIV ANTIBODY (ROUTINE TESTING W REFLEX): HIV Screen 4th Generation wRfx: NONREACTIVE

## 2020-11-22 LAB — MAGNESIUM: Magnesium: 1.8 mg/dL (ref 1.7–2.4)

## 2020-11-22 LAB — PHOSPHORUS: Phosphorus: 2.8 mg/dL (ref 2.5–4.6)

## 2020-11-22 MED ORDER — PROCHLORPERAZINE EDISYLATE 10 MG/2ML IJ SOLN: 5.0000 mg | Freq: Four times a day (QID) | INTRAMUSCULAR | Status: DC | PRN

## 2020-11-22 MED ORDER — PIPERACILLIN-TAZOBACTAM 3.375 G IVPB
3.3750 g | Freq: Three times a day (TID) | INTRAVENOUS | Status: DC
  Administered 2020-11-22 – 2020-11-24 (×6): 3.375 g via INTRAVENOUS
  Filled 2020-11-22 (×6): qty 50

## 2020-11-22 MED ORDER — DIPHENHYDRAMINE HCL 50 MG/ML IJ SOLN: 12.5000 mg | Freq: Four times a day (QID) | INTRAMUSCULAR | Status: DC | PRN

## 2020-11-22 MED ORDER — PIPERACILLIN-TAZOBACTAM 3.375 G IVPB 30 MIN
3.3750 g | Freq: Three times a day (TID) | INTRAVENOUS | Status: DC
  Administered 2020-11-22: 3.375 g via INTRAVENOUS
  Filled 2020-11-22: qty 50

## 2020-11-22 MED ORDER — OXYCODONE HCL 5 MG PO TABS
5.0000 mg | ORAL_TABLET | Freq: Four times a day (QID) | ORAL | Status: DC | PRN
Start: 2020-11-22 — End: 2020-11-24
  Administered 2020-11-22 – 2020-11-23 (×2): 5 mg via ORAL
  Filled 2020-11-22 (×2): qty 1

## 2020-11-22 MED ORDER — HYDROMORPHONE HCL 1 MG/ML IJ SOLN
1.0000 mg | Freq: Once | INTRAMUSCULAR | Status: AC
  Administered 2020-11-22: 1 mg via INTRAVENOUS
  Filled 2020-11-22: qty 1

## 2020-11-22 MED ORDER — MORPHINE SULFATE (PF) 2 MG/ML IV SOLN
1.0000 mg | INTRAVENOUS | Status: DC | PRN
Start: 2020-11-22 — End: 2020-11-24

## 2020-11-22 MED ORDER — HYDROMORPHONE HCL 1 MG/ML IJ SOLN: 0.5000 mg | INTRAMUSCULAR | Status: DC | PRN

## 2020-11-22 MED ORDER — ACETAMINOPHEN 325 MG PO TABS: 650.0000 mg | ORAL_TABLET | Freq: Four times a day (QID) | ORAL | Status: DC | PRN

## 2020-11-22 MED ORDER — ACETAMINOPHEN 650 MG RE SUPP: 650.0000 mg | Freq: Four times a day (QID) | RECTAL | Status: DC | PRN

## 2020-11-22 MED ORDER — MELATONIN 3 MG PO TABS
3.0000 mg | ORAL_TABLET | Freq: Every evening | ORAL | Status: DC | PRN
  Filled 2020-11-22: qty 1

## 2020-11-22 MED ORDER — ONDANSETRON 4 MG PO TBDP: 4.0000 mg | ORAL_TABLET | Freq: Four times a day (QID) | ORAL | Status: DC | PRN

## 2020-11-22 MED ORDER — PANTOPRAZOLE SODIUM 40 MG IV SOLR
40.0000 mg | Freq: Every day | INTRAVENOUS | Status: DC
  Administered 2020-11-22 – 2020-11-23 (×2): 40 mg via INTRAVENOUS
  Filled 2020-11-22 (×3): qty 40

## 2020-11-22 MED ORDER — ONDANSETRON HCL 4 MG/2ML IJ SOLN: 4.0000 mg | Freq: Four times a day (QID) | INTRAMUSCULAR | Status: DC | PRN

## 2020-11-22 MED ORDER — PROCHLORPERAZINE MALEATE 10 MG PO TABS
10.0000 mg | ORAL_TABLET | Freq: Four times a day (QID) | ORAL | Status: DC | PRN
Start: 2020-11-22 — End: 2020-11-24
  Filled 2020-11-22: qty 1

## 2020-11-22 MED ORDER — DIPHENHYDRAMINE HCL 12.5 MG/5ML PO ELIX: 12.5000 mg | ORAL_SOLUTION | Freq: Four times a day (QID) | ORAL | Status: DC | PRN

## 2020-11-22 MED ORDER — ENOXAPARIN SODIUM 40 MG/0.4ML IJ SOSY
40.0000 mg | PREFILLED_SYRINGE | INTRAMUSCULAR | Status: DC
  Administered 2020-11-22 – 2020-11-24 (×3): 40 mg via SUBCUTANEOUS
  Filled 2020-11-22 (×3): qty 0.4

## 2020-11-22 MED ORDER — POTASSIUM CHLORIDE IN NACL 20-0.9 MEQ/L-% IV SOLN
INTRAVENOUS | Status: DC
  Filled 2020-11-22: qty 2000
  Filled 2020-11-22: qty 1000

## 2020-11-22 MED ORDER — HYDROCHLOROTHIAZIDE 25 MG PO TABS
25.0000 mg | ORAL_TABLET | Freq: Every day | ORAL | Status: DC
  Administered 2020-11-22 – 2020-11-24 (×3): 25 mg via ORAL
  Filled 2020-11-22 (×3): qty 1

## 2020-11-22 MED ORDER — PIPERACILLIN-TAZOBACTAM 3.375 G IVPB: 3.3750 g | Freq: Three times a day (TID) | INTRAVENOUS | Status: DC

## 2020-11-22 NOTE — ED Provider Notes (Addendum)
Emergency Medicine Re-evaluation Note  Carla Scott is a 45 y.o. female, seen on rounds today.  Pt initially presented to the ED for complaints of Abdominal Pain Currently, the patient is Waiting for bed for inpatient admission.  Physical Exam  BP 128/84 (BP Location: Right Arm)   Pulse 98   Temp 99.1 F (37.3 C) (Oral)   Resp (!) 21   LMP 11/06/2020 (Exact Date) Comment: preg test waiver signed  SpO2 100%  Physical Exam General: resting comfortably Cardiac: warm and well perfused Lungs: even unlabored Psych: calm Abd: generalized TTP but no rebound or guardign  ED Course / MDM  EKG:   I have reviewed the labs performed to date as well as medications administered while in observation.  Recent changes in the last 24 hours include case d/w surgery, ultimately admitted to [facility], awaiting bed; I have schedule zosyn for q8 hours and provided additional pain meds.  Plan  Current plan is for admit to [facility], zosyn, prn dilaudid for pain.   Lucrezia Starch, MD 11/22/20 [phone]   10:48 AM discussed case with Dr. Georgette Dover -he states that the current plan for patient is trial of IV antibiotics today, does not anticipate any urgent surgical intervention, he is fine if patient needs to be admitted to [facility] for better bed availability; discussed with [facility] who states he will discuss with bed placement, I have modified the admission order to reflect this.  If patient is admitted to [facility], the [facility] team at [facility] will need to be notifed.   11:02 AM   discussed the case with Dr. Tomi Bamberger at [facility], he will accept ER to ER to board at [facility]. Our Charge will reach out to charge at [facility]  When patient gets to [facility] ER, admitting team will need to be notified.    Lucrezia Starch, MD 11/22/20 [phone]

## 2020-11-22 NOTE — ED Notes (Signed)
[facility] called for transport to [facility] 

## 2020-11-23 LAB — BASIC METABOLIC PANEL
Anion gap: 8 (ref 5–15)
BUN: 7 mg/dL (ref 6–20)
CO2: 22 mmol/L (ref 22–32)
Calcium: 9.1 mg/dL (ref 8.9–10.3)
Chloride: 109 mmol/L (ref 98–111)
Creatinine, Ser: 0.89 mg/dL (ref 0.44–1.00)
GFR, Estimated: >60 mL/min (ref >60)
Glucose, Bld: 81 mg/dL (ref 70–99)
Potassium: 3.5 mmol/L (ref 3.5–5.1)
Sodium: 139 mmol/L (ref 135–145)

## 2020-11-23 LAB — CBC
HCT: 31.5 % — ABNORMAL LOW (ref 36.0–46.0)
Hemoglobin: 10.4 g/dL — ABNORMAL LOW (ref 12.0–15.0)
MCH: 31.1 pg (ref 26.0–34.0)
MCHC: 33 g/dL (ref 30.0–36.0)
MCV: 94.3 fL (ref 80.0–100.0)
Platelets: 165 10*3/uL (ref 150–400)
RBC: 3.34 MIL/uL — ABNORMAL LOW (ref 3.87–5.11)
RDW: 12.8 % (ref 11.5–15.5)
WBC: 10.8 10*3/uL — ABNORMAL HIGH (ref 4.0–10.5)
nRBC: 0 % (ref 0.0–0.2)

## 2020-11-23 LAB — HIV ANTIBODY (ROUTINE TESTING W REFLEX): HIV Screen 4th Generation wRfx: NONREACTIVE

## 2020-11-23 MED ORDER — SODIUM CHLORIDE 0.9 % IV SOLN: 250.0000 mL | INTRAVENOUS | Status: DC | PRN

## 2020-11-23 MED ORDER — SODIUM CHLORIDE 0.9% FLUSH
3.0000 mL | Freq: Two times a day (BID) | INTRAVENOUS | Status: DC
  Administered 2020-11-23: 3 mL via INTRAVENOUS

## 2020-11-23 MED ORDER — SODIUM CHLORIDE 0.9% FLUSH: 3.0000 mL | INTRAVENOUS | Status: DC | PRN

## 2020-11-23 NOTE — TOC Initial Note (Signed)
Transition of Care [facility]) - Initial/Assessment Note    Patient Details  Name: Carla Scott MRN: [id number] Date of Birth: 02-07-1976  Transition of [facility] CM/SW Contact:    Lennart Pall, LCSW Phone Number: 11/23/2020, 2:59 PM  Clinical Narrative:                 Met with pt today to introduce TOC role and discuss any dc planning needs.  Referral made for TOC to assist with PCP. Pt very pleasant and reports that she recently moved to this area and is currently uninsured and unemployed, however, in the process of job interviews.  She is agreeable to any assistance with establishing PCP as well as any mental health resource info.  I have made a referral for new patient appointment at [facility] at [facility] on 9/9@ 11:10 am.  Will provide handouts on local mental health resources as well.  TOC available if any additional needs.  Expected Discharge Plan: Home/Self Care Barriers to Discharge: Continued Medical Work up   Patient Goals and CMS Choice Patient states their goals for this hospitalization and ongoing recovery are:: return home      Expected Discharge Plan and Services Expected Discharge Plan: Home/Self Care In-house Referral: Clinical Social Work Discharge Planning Services: [facility]   Living arrangements for the past 2 months: Single Family Home                 DME Arranged: N/A DME Agency: NA                  Prior Living Arrangements/Services Living arrangements for the past 2 months: Single Family Home Lives with:: Relatives   Do you feel safe going back to the place where you live?: Yes      Need for Family Participation in Patient Care: No (Comment) Care giver support system in place?: Yes (comment)   Criminal Activity/Legal Involvement Pertinent to Current Situation/Hospitalization: No - Comment as needed  Activities of Daily Living Home Assistive Devices/Equipment: Eyeglasses ADL Screening (condition at time  of admission) Patient's cognitive ability adequate to safely complete daily activities?: Yes Is the patient deaf or have difficulty hearing?: No Does the patient have difficulty seeing, even when wearing glasses/contacts?: No Does the patient have difficulty concentrating, remembering, or making decisions?: No Patient able to express need for assistance with ADLs?: Yes Does the patient have difficulty dressing or bathing?: No Independently performs ADLs?: Yes (appropriate for developmental age) Does the patient have difficulty walking or climbing stairs?: No Weakness of Legs: None Weakness of Arms/Hands: None  Permission Sought/Granted                  Emotional Assessment Appearance:: Appears stated age Attitude/Demeanor/Rapport: Engaged, Gracious Affect (typically observed): Accepting Orientation: : Oriented to Self, Oriented to Place, Oriented to  Time, Oriented to Situation Alcohol / Substance Use: Not Applicable Psych Involvement: No (comment)  Admission diagnosis:  Diverticulitis large intestine [K57.32] Diverticulitis of large intestine with perforation without bleeding [K57.20] Patient Active Problem List   Diagnosis Date Noted   Diverticulitis large intestine 11/21/2020   PCP:  Pcp, No Pharmacy:   [facility] [address] [place] [address] Phone: [phone] Fax: [phone]     Social Determinants of Health (SDOH) Interventions    Readmission Risk Interventions Readmission Risk Prevention Plan 11/23/2020  Post Dischage Appt  Complete  Medication Screening Complete  Transportation Screening Complete

## 2020-11-23 NOTE — Progress Notes (Signed)
Progress Note     Subjective: Patient reports she feels much improved. Denies nausea or vomiting. Having bowel function.   Objective: Vital signs in last 24 hours: Temp:  [98.4 F (36.9 C)-99 F (37.2 C)] 98.4 F (36.9 C) (08/30 0949) Pulse Rate:  [77-91] 79 (08/30 0949) Resp:  [16-18] 18 (08/30 0949) BP: (134-149)/(76-101) 149/101 (08/30 0949) SpO2:  [100 %] 100 % (08/30 0949) Last BM Date: 11/21/20  Intake/Output from previous day: 08/29 0701 - 08/30 0700 In: 1653.8 [P.O.:120; I.V.:1361.1; IV Piggyback:172.7] Out: 1550 [Urine:1550] Intake/Output this shift: Total I/O In: -  Out: 400 [Urine:400]  PE: General: pleasant, WD, overweight female who is laying in bed in NAD HEENT: head is normocephalic, atraumatic.  Sclera are noninjected.  PERRL.  Ears and nose without any masses or lesions.  Mouth is pink and moist Heart: regular, rate, and rhythm.  Normal s1,s2. No obvious murmurs, gallops, or rubs noted.  Palpable radial and pedal pulses bilaterally Lungs: CTAB, no wheezes, rhonchi, or rales noted.  Respiratory effort nonlabored Abd: soft, mild ttp in LLQ without peritonitis, ND, +BS, no masses, hernias, or organomegaly MS: all 4 extremities are symmetrical with no cyanosis, clubbing, or edema. Skin: warm and dry with no masses, lesions, or rashes Neuro: Cranial nerves 2-12 grossly intact, sensation is normal throughout Psych: A&Ox3 with an appropriate affect.   Lab Results:  Recent Labs    11/22/20 0917 11/23/20 0423  WBC 16.3* 10.8*  HGB 10.7* 10.4*  HCT 32.7* 31.5*  PLT 182 165   BMET Recent Labs    11/22/20 0917 11/23/20 0423  NA 137 139  K 3.6 3.5  CL 105 109  CO2 24 22  GLUCOSE 108* 81  BUN 7 7  CREATININE 0.96 0.89  CALCIUM 8.9 9.1   PT/INR No results for input(s): LABPROT, INR in the last 72 hours. CMP     Component Value Date/Time   NA 139 11/23/2020 0423   K 3.5 11/23/2020 0423   CL 109 11/23/2020 0423   CO2 22 11/23/2020 0423    GLUCOSE 81 11/23/2020 0423   BUN 7 11/23/2020 0423   CREATININE 0.89 11/23/2020 0423   CALCIUM 9.1 11/23/2020 0423   PROT 7.7 11/21/2020 1954   ALBUMIN 4.5 11/21/2020 1954   AST 9 (L) 11/21/2020 1954   ALT 8 11/21/2020 1954   ALKPHOS 46 11/21/2020 1954   BILITOT 0.6 11/21/2020 1954   GFRNONAA >60 11/23/2020 0423   GFRAA >60 11/27/2019 1217   Lipase     Component Value Date/Time   LIPASE 17 11/21/2020 1954       Studies/Results: CT Abdomen Pelvis W Contrast  Result Date: 11/21/2020 CLINICAL DATA:  Abdominal pain. EXAM: CT ABDOMEN AND PELVIS WITH CONTRAST TECHNIQUE: Multidetector CT imaging of the abdomen and pelvis was performed using the standard protocol following bolus administration of intravenous contrast. CONTRAST:  29m OMNIPAQUE IOHEXOL 350 MG/ML SOLN COMPARISON:  None. FINDINGS: Lower chest: The visualized lung bases are clear. No intra-abdominal free air.  Small free fluid in the pelvis. Hepatobiliary: No focal liver abnormality is seen. No gallstones, gallbladder wall thickening, or biliary dilatation. Pancreas: Unremarkable. No pancreatic ductal dilatation or surrounding inflammatory changes. Spleen: Normal in size without focal abnormality. Adrenals/Urinary Tract: The left adrenal glands unremarkable. There is a 1 cm indeterminate right adrenal nodule. The kidneys, visualized ureters, and urinary bladder appear unremarkable. Stomach/Bowel: There is diverticulosis of the distal descending colon with inflammatory changes consistent with acute diverticulitis. No diverticular abscess. Several punctate  foci of air along the wall of the colon (55/2 and 64/5) may be extraluminal and represent focally contained micro perforations. There is no bowel obstruction. The appendix is normal. Vascular/Lymphatic: Mild aortoiliac atherosclerotic disease. The IVC is unremarkable no venous gas. There is no adenopathy. Reproductive: The uterus is anteverted and grossly unremarkable. There is a 4.5 cm  left ovarian cyst. No follow-up imaging recommended. Note: This recommendation does not apply to premenarchal patients and to those with increased risk (genetic, family history, elevated tumor markers or other high-risk factors) of ovarian cancer. Reference: JACR 2020 Feb; 17(2):248-254 Other: None Musculoskeletal: No acute or significant osseous findings. IMPRESSION: 1. Acute diverticulitis of the distal descending colon with possible microperforation. No diverticular abscess. 2. A 4.5 cm left ovarian cyst. 3. There is a 1 cm indeterminate right adrenal nodule. 4. Aortic Atherosclerosis (ICD10-I70.0). Electronically Signed   By: Anner Crete M.D.   On: 11/21/2020 21:37    Anti-infectives: Anti-infectives (From admission, onward)    Start     Dose/Rate Route Frequency Ordered Stop   11/22/20 1400  piperacillin-tazobactam (ZOSYN) IVPB 3.375 g        3.375 g 12.5 mL/hr over 240 Minutes Intravenous Every 8 hours 11/22/20 0844     11/22/20 0915  piperacillin-tazobactam (ZOSYN) IVPB 3.375 g  Status:  Discontinued        3.375 g 12.5 mL/hr over 240 Minutes Intravenous Every 8 hours 11/22/20 0901 11/22/20 0911   11/22/20 0800  piperacillin-tazobactam (ZOSYN) IVPB 3.375 g  Status:  Discontinued        3.375 g 100 mL/hr over 30 Minutes Intravenous Every 8 hours 11/22/20 0747 11/22/20 0843   11/21/20 2245  piperacillin-tazobactam (ZOSYN) IVPB 3.375 g        3.375 g 100 mL/hr over 30 Minutes Intravenous  Once 11/21/20 2234 11/22/20 0013        Assessment/Plan Acute diverticulitis of descending colon with possible microperforation - CT 8/28 showed acute diverticulitis with possible microperforation without abscess - WBC 10.8 from 16.3, VSS - abdominal exam significantly improved today  - advance to [facility] and possibly to [facility] this afternoon - hopefully we can get patient over this episode without surgical intervention. Would need colonoscopy in 6-8 weeks from discharge.    FEN: CLD, IVF '@50'$   cc/h VTE: SCDs, LMWH ID: zosyn 8/28>>   HTN - home meds L ovarian cyst - OP follow up with GYN, confirmed with radiology 1 cm indeterminate R adrenal nodule - recommend OP follow up   LOS: 1 day    Norm Parcel, [facility] 11/23/2020, 10:53 AM Please see Amion for pager number during day hours 7:00am-4:30pm

## 2020-11-24 LAB — CBC
HCT: 31.8 % — ABNORMAL LOW (ref 36.0–46.0)
Hemoglobin: 10.6 g/dL — ABNORMAL LOW (ref 12.0–15.0)
MCH: 30.8 pg (ref 26.0–34.0)
MCHC: 33.3 g/dL (ref 30.0–36.0)
MCV: 92.4 fL (ref 80.0–100.0)
Platelets: 183 10*3/uL (ref 150–400)
RBC: 3.44 MIL/uL — ABNORMAL LOW (ref 3.87–5.11)
RDW: 12.4 % (ref 11.5–15.5)
WBC: 7.1 10*3/uL (ref 4.0–10.5)
nRBC: 0 % (ref 0.0–0.2)

## 2020-11-24 LAB — BASIC METABOLIC PANEL
Anion gap: 7 (ref 5–15)
BUN: 6 mg/dL (ref 6–20)
CO2: 25 mmol/L (ref 22–32)
Calcium: 9.3 mg/dL (ref 8.9–10.3)
Chloride: 105 mmol/L (ref 98–111)
Creatinine, Ser: 1.13 mg/dL — ABNORMAL HIGH (ref 0.44–1.00)
GFR, Estimated: >60 mL/min (ref >60)
Glucose, Bld: 95 mg/dL (ref 70–99)
Potassium: 3.6 mmol/L (ref 3.5–5.1)
Sodium: 137 mmol/L (ref 135–145)

## 2020-11-24 MED ORDER — HYDROCHLOROTHIAZIDE 25 MG PO TABS: 25.0000 mg | ORAL_TABLET | Freq: Every day | ORAL | 0 refills | Status: DC

## 2020-11-24 MED ORDER — AMOXICILLIN-POT CLAVULANATE 875-125 MG PO TABS: 1.0000 | ORAL_TABLET | Freq: Two times a day (BID) | ORAL | 0 refills | Status: AC

## 2020-11-24 MED ORDER — ACETAMINOPHEN 325 MG PO TABS: 650.0000 mg | ORAL_TABLET | Freq: Four times a day (QID) | ORAL | Status: DC | PRN

## 2020-11-24 NOTE — Progress Notes (Signed)
Nutrition Education Note  RD consulted for nutrition education regarding nutrition management for diverticulosis/ Diverticulitis.    RD provided "Fiber Restricted Nutrition Therapy" handout as well as "High Fiber Nutrition Therapy" handout from the Academy of [employer]. Reviewed patient's dietary recall and discussed ways for pt to meet nutrition goals over the next several weeks. Explained reasons for pt to follow a low fiber diet over the next 6-8 weeks. Reviewed low fiber foods and high fiber foods. Discussed best practice for long term management of diverticulosis is a high fiber diet and discussed ways to gradually increase fiber in the diet.   Teach back method used. Pt verbalizes understanding of information provided.   Expect good compliance. Pt seems very motivated to make changes.  Body mass index is There is no height or weight on file to calculate BMI..   Current diet order is soft, patient is consuming approximately 100% of meals at this time. Labs and medications reviewed. No further nutrition interventions warranted at this time.  If additional nutrition issues arise, please re-consult RD.  Carla Bibles, MS, RD, LDN Inpatient Clinical Dietitian Contact information available via Amion

## 2020-11-24 NOTE — Plan of Care (Signed)
Pt was discharged home today. Instructions were reviewed with patient, and questions were answered. Pt was taken to main entrance via wheelchair by NT.  

## 2020-11-24 NOTE — Discharge Summary (Signed)
[facility] Discharge Summary   Patient ID: Edelynn Hyder MRN: [id number] DOB/AGE: 10/06/1975 45 y.o.  Admit date: 11/21/2020 Discharge date: 11/24/2020  Admitting Diagnosis: Diverticulitis   Discharge Diagnosis Patient Active Problem List   Diagnosis Date Noted   Diverticulitis large intestine 11/21/2020    Consultants None   Imaging: No results found.  Procedures None   Hospital Course:  Patient is a 45 year old female who presented to [facility] with abdominal pain.  Workup showed acute diverticulitis with possible microperforation.  Patient was admitted and treated with IV abx and bowel rest. Diet was advanced as pain and leukocytosis improved.  On 11/24/20, the patient was voiding well, tolerating diet, ambulating well, pain well controlled, vital signs stable and felt stable for discharge home.  Patient will follow up with new PCP arranged by [facility] and understands need for colonoscopy in several weeks.   Physical Exam: General: pleasant, WD, overweight female who is laying in bed in NAD Heart: regular, rate, and rhythm.   Lungs: CTAB, no wheezes, rhonchi, or rales noted.  Respiratory effort nonlabored Abd: soft, minimal ttp in LLQ without peritonitis, ND, +BS, no masses, hernias, or organomegaly MS: all 4 extremities are symmetrical with no cyanosis, clubbing, or edema. Skin: warm and dry with no masses, lesions, or rashes Neuro: Cranial nerves 2-12 grossly intact, sensation is normal throughout Psych: A&Ox3 with an appropriate affect.   Allergies as of 11/24/2020   No Known Allergies      Medication List     TAKE these medications    acetaminophen 325 MG tablet Commonly known as: TYLENOL Take 2 tablets (650 mg total) by mouth every 6 (six) hours as needed for mild pain, fever or headache (or temp > 100).   amoxicillin-clavulanate 875-125 MG tablet Commonly known as: Augmentin Take 1 tablet by mouth every 12 (twelve) hours for 12 days.    hydrochlorothiazide 25 MG tablet Commonly known as: HYDRODIURIL Take 1 tablet (25 mg total) by mouth daily.   Probiotic Acidophilus Caps Take 1 capsule by mouth 3 (three) times a week.          Follow-up Information     [facility]. Go on 12/03/2020.   Why: 11:10 AM. Establish care for HTN. You will also need referral for colonoscopy in 6-8 weeks once you are over acute diverticulitis. Contact information: [address], [place] [phone]        [facility] [place]. Call.   Specialty: General Surgery Why: You may call and schedule a follow up appointment with one of our colorectal surgeons after colonoscopy if you would like to discusse elective colon resection for diverticulosis Contact information: [address] [place] [address] [phone]                 Signed: Norm Parcel , [facility] 11/24/2020, 11:09 AM Please see Amion for pager number during day hours 7:00am-4:30pm

## 2020-11-27 LAB — CULTURE, BLOOD (ROUTINE X 2)
Culture: NO GROWTH
Culture: NO GROWTH
Specimen Description: ADEQUATE
Specimen Description: ADEQUATE

## 2020-11-28 NOTE — Progress Notes (Signed)
Erroneous encounter

## 2020-12-02 ENCOUNTER — Other Ambulatory Visit: Payer: Self-pay

## 2020-12-03 ENCOUNTER — Encounter: Payer: Self-pay | Admitting: Family

## 2020-12-03 DIAGNOSIS — Z09 Encounter for follow-up examination after completed treatment for conditions other than malignant neoplasm: Secondary | ICD-10-CM

## 2020-12-03 DIAGNOSIS — Z7689 Persons encountering health services in other specified circumstances: Secondary | ICD-10-CM

## 2020-12-03 DIAGNOSIS — K5732 Diverticulitis of large intestine without perforation or abscess without bleeding: Secondary | ICD-10-CM

## 2020-12-03 DIAGNOSIS — I10 Essential (primary) hypertension: Secondary | ICD-10-CM

## 2021-04-13 ENCOUNTER — Other Ambulatory Visit: Payer: Self-pay

## 2021-04-13 ENCOUNTER — Emergency Department (HOSPITAL_COMMUNITY): Payer: 59

## 2021-04-13 ENCOUNTER — Emergency Department (HOSPITAL_COMMUNITY)
Admission: EM | Admit: 2021-04-13 | Discharge: 2021-04-13 | Disposition: A | Discharge status: Home / Self Care | Payer: 59 | Attending: Emergency Medicine | Admitting: Emergency Medicine

## 2021-04-13 DIAGNOSIS — R1032 Left lower quadrant pain: Secondary | ICD-10-CM | POA: Diagnosis present

## 2021-04-13 DIAGNOSIS — G8929 Other chronic pain: Secondary | ICD-10-CM | POA: Insufficient documentation

## 2021-04-13 DIAGNOSIS — K5732 Diverticulitis of large intestine without perforation or abscess without bleeding: Secondary | ICD-10-CM | POA: Insufficient documentation

## 2021-04-13 DIAGNOSIS — K5792 Diverticulitis of intestine, part unspecified, without perforation or abscess without bleeding: Secondary | ICD-10-CM

## 2021-04-13 DIAGNOSIS — I10 Essential (primary) hypertension: Secondary | ICD-10-CM | POA: Diagnosis not present

## 2021-04-13 DIAGNOSIS — R102 Pelvic and perineal pain: Secondary | ICD-10-CM | POA: Insufficient documentation

## 2021-04-13 LAB — CBC WITH DIFFERENTIAL/PLATELET
Abs Immature Granulocytes: 0.03 10*3/uL (ref 0.00–0.07)
Basophils Absolute: 0.1 10*3/uL (ref 0.0–0.1)
Basophils Relative: 1 %
Eosinophils Absolute: 0.1 10*3/uL (ref 0.0–0.5)
Eosinophils Relative: 1 %
HCT: 35.4 % — ABNORMAL LOW (ref 36.0–46.0)
Hemoglobin: 11.3 g/dL — ABNORMAL LOW (ref 12.0–15.0)
Immature Granulocytes: 0 %
Lymphocytes Relative: 34 %
Lymphs Abs: 3.3 10*3/uL (ref 0.7–4.0)
MCH: 30.2 pg (ref 26.0–34.0)
MCHC: 31.9 g/dL (ref 30.0–36.0)
MCV: 94.7 fL (ref 80.0–100.0)
Monocytes Absolute: 0.5 10*3/uL (ref 0.1–1.0)
Monocytes Relative: 5 %
Neutro Abs: 5.7 10*3/uL (ref 1.7–7.7)
Neutrophils Relative %: 59 %
Platelets: 231 10*3/uL (ref 150–400)
RBC: 3.74 MIL/uL — ABNORMAL LOW (ref 3.87–5.11)
RDW: 14.3 % (ref 11.5–15.5)
WBC: 9.6 10*3/uL (ref 4.0–10.5)
nRBC: 0 % (ref 0.0–0.2)

## 2021-04-13 LAB — URINALYSIS, ROUTINE W REFLEX MICROSCOPIC
Glucose, UA: NEGATIVE mg/dL
Hgb urine dipstick: NEGATIVE
Ketones, ur: NEGATIVE mg/dL
Leukocytes,Ua: NEGATIVE
Nitrite: NEGATIVE
Protein, ur: NEGATIVE mg/dL
Specific Gravity, Urine: >1.03 — ABNORMAL HIGH (ref 1.005–1.030)
pH: 5.5 (ref 5.0–8.0)

## 2021-04-13 LAB — COMPREHENSIVE METABOLIC PANEL
ALT: 11 U/L (ref 0–44)
AST: 16 U/L (ref 15–41)
Albumin: 4.2 g/dL (ref 3.5–5.0)
Alkaline Phosphatase: 48 U/L (ref 38–126)
Anion gap: 7 (ref 5–15)
BUN: 10 mg/dL (ref 6–20)
CO2: 24 mmol/L (ref 22–32)
Calcium: 9.1 mg/dL (ref 8.9–10.3)
Chloride: 107 mmol/L (ref 98–111)
Creatinine, Ser: 1.13 mg/dL — ABNORMAL HIGH (ref 0.44–1.00)
GFR, Estimated: >60 mL/min (ref >60)
Glucose, Bld: 100 mg/dL — ABNORMAL HIGH (ref 70–99)
Potassium: 3.7 mmol/L (ref 3.5–5.1)
Sodium: 138 mmol/L (ref 135–145)
Total Bilirubin: 0.5 mg/dL (ref 0.3–1.2)
Total Protein: 7.3 g/dL (ref 6.5–8.1)

## 2021-04-13 LAB — LIPASE, BLOOD: Lipase: 42 U/L (ref 11–51)

## 2021-04-13 LAB — I-STAT BETA HCG BLOOD, ED (MC, WL, AP ONLY): I-stat hCG, quantitative: <5 m[IU]/mL (ref <5)

## 2021-04-13 MED ORDER — ONDANSETRON HCL 4 MG PO TABS: 4.0000 mg | ORAL_TABLET | Freq: Every day | ORAL | 1 refills | Status: AC | PRN

## 2021-04-13 MED ORDER — AMOXICILLIN-POT CLAVULANATE 875-125 MG PO TABS
1.0000 | ORAL_TABLET | Freq: Two times a day (BID) | ORAL | 0 refills | Status: AC
Start: 2021-04-13 — End: 2021-04-23

## 2021-04-13 MED ORDER — OXYCODONE-ACETAMINOPHEN 5-325 MG PO TABS
1.0000 | ORAL_TABLET | Freq: Once | ORAL | Status: AC
  Administered 2021-04-13: 1 via ORAL
  Filled 2021-04-13: qty 1

## 2021-04-13 MED ORDER — SODIUM CHLORIDE 0.9 % IV SOLN: Freq: Once | INTRAVENOUS | Status: AC

## 2021-04-13 MED ORDER — HYDROMORPHONE HCL 1 MG/ML IJ SOLN
1.0000 mg | Freq: Once | INTRAMUSCULAR | Status: DC
  Administered 2021-04-13: 1 mg via INTRAVENOUS
  Filled 2021-04-13: qty 1

## 2021-04-13 MED ORDER — HYDROMORPHONE HCL 1 MG/ML IJ SOLN: 0.5000 mg | Freq: Once | INTRAMUSCULAR | Status: DC

## 2021-04-13 MED ORDER — IOHEXOL 300 MG/ML  SOLN
100.0000 mL | Freq: Once | INTRAMUSCULAR | Status: AC
  Administered 2021-04-13: 75 mL via INTRAVENOUS

## 2021-04-13 MED ORDER — AMOXICILLIN-POT CLAVULANATE 875-125 MG PO TABS
1.0000 | ORAL_TABLET | Freq: Two times a day (BID) | ORAL | Status: DC
Start: 2021-04-13 — End: 2021-04-13

## 2021-04-13 MED ORDER — ONDANSETRON HCL 4 MG/2ML IJ SOLN
4.0000 mg | Freq: Once | INTRAMUSCULAR | Status: AC
  Administered 2021-04-13: 4 mg via INTRAVENOUS
  Filled 2021-04-13: qty 2

## 2021-04-13 NOTE — ED Notes (Signed)
Returned from [place]. Sitting in bed. Vomited in emesis bag. MD at bedside. Patient states "I think it is the pain meds that got me sick"

## 2021-04-13 NOTE — ED Triage Notes (Signed)
Pt reported to ED with c/o lower abdominal pain that radiates into back  that has been ongoing intermittently x past few weeks. Pt reports history of diverticulitis. Denies any urinary tract pain, frequent urination or burning.

## 2021-04-13 NOTE — ED Provider Notes (Signed)
[name] Provider Note   CSN: [id number] Arrival date & time: 04/13/21  0345     History  Chief Complaint  Patient presents with   Abdominal Pain    Carla Scott is a 46 y.o. female with past medical history of hypertension who presents complaining of severe mostly left lower quadrant abdominal pain over the last 3 days.  Patient states that she has chronic abdominal pain in this area that she has been experiencing since August 2022 that normally she is able to symptomatically manage at home.  Initially the pain began in her left lower quadrant and left pelvic area however it has radiated up and through bilateral flanks and around her umbilicus.  The pain is not constant and is described as feeling like a contraction.  It is a 10/10 in pain. At this time she states that she has been having trouble with ambulation secondary to pain and that over the last day she has been having issues sitting down due to pain that radiates up through her coccyx and to bilateral flanks.  She feels some relief from the pain when propping her feet up in front of her.  Ambulation is also limited secondary to pain.  Patient describes an intermittent fever that she has been experiencing for some time now.  She is able to tolerate p.o. intake but is limited to bland foods like chicken broth, rice, cooked again.  She denies nausea, vomiting, GI upset however has had pain with defecation recently.  She also reports questionable black-colored stool in the last month but has not had recurrence of this episode.  No urinary symptoms reported.  Patient has a history of left ovarian torsion with procedure to correct the torsion in 2016.  At the time she was told the left ovary was removed however she has since been made aware that it was not.  She has a cyst on that side.  FDLMP 01/04.  No concern for pregnancy.  Patient is never undergone colonoscopy.     Home Medications Prior  to Admission medications   Medication Sig Start Date End Date Taking? Authorizing Provider  amoxicillin-clavulanate (AUGMENTIN) 875-125 MG tablet Take 1 tablet by mouth 2 (two) times daily for 10 days. 04/13/21 04/23/21 Yes Ellanora Rayborn, Raquel Sarna, DO  ondansetron (ZOFRAN) 4 MG tablet Take 1 tablet (4 mg total) by mouth daily as needed for nausea or vomiting. 04/13/21 04/13/22 Yes Farrel Gordon, DO  acetaminophen (TYLENOL) 325 MG tablet Take 2 tablets (650 mg total) by mouth every 6 (six) hours as needed for mild pain, fever or headache (or temp > 100). 11/24/20   Norm Parcel, PA-C  hydrochlorothiazide (HYDRODIURIL) 25 MG tablet Take 1 tablet (25 mg total) by mouth daily. 11/24/20   Norm Parcel, PA-C  Lactobacillus (PROBIOTIC ACIDOPHILUS) CAPS Take 1 capsule by mouth 3 (three) times a week.    [provider]      Allergies    Patient has no known allergies.    Review of Systems   Review of Systems  Constitutional:  Positive for activity change, appetite change and fever.  Gastrointestinal:  Positive for abdominal distention and abdominal pain. Negative for anal bleeding, blood in stool, constipation, diarrhea, nausea, rectal pain and vomiting.  Genitourinary:  Positive for flank pain. Negative for dyspareunia, dysuria and frequency.  Musculoskeletal:  Positive for back pain. Negative for gait problem.   Physical Exam Updated Vital Signs BP (!) 150/88 (BP Location: Right Arm)  Pulse (!) 101    Temp 98.1 F (36.7 C) (Oral)    Resp (!) 22    LMP 04/01/2021 (Exact Date)    SpO2 92%  Constitutional: Patient is tearful with pain. Cardio: Regular rate and rhythm.  No murmurs, rubs, gallops. Pulm: Normal breathing on room air. Abdomen: Abdomen is distended with involuntary guarding.  Patient is diffusely tender to palpation with left lower quadrant and left upper quadrant most concerning.  Negative Murphy sign.  No tenderness noted at McBurney point. MSK: Negative for extremity Skin: Skin is  warm and dry. Neuro: Alert and oriented x3.  No focal deficit noted Psych: Normal mood and affect.  ED Results / Procedures / Treatments   Labs (all labs ordered are listed, but only abnormal results are displayed) Labs Reviewed  CBC WITH DIFFERENTIAL/PLATELET - Abnormal; Notable for the following components:      Result Value   RBC 3.74 (*)    Hemoglobin 11.3 (*)    HCT 35.4 (*)    All other components within normal limits  COMPREHENSIVE METABOLIC PANEL - Abnormal; Notable for the following components:   Glucose, Bld 100 (*)    Creatinine, Ser 1.13 (*)    All other components within normal limits  URINALYSIS, ROUTINE W REFLEX MICROSCOPIC - Abnormal; Notable for the following components:   Specific Gravity, Urine >1.030 (*)    Bilirubin Urine SMALL (*)    All other components within normal limits  LIPASE, BLOOD  I-STAT BETA HCG BLOOD, ED ([facility], [facility], [facility] ONLY)    EKG None  Radiology CT ABDOMEN PELVIS W CONTRAST  Result Date: 04/13/2021 CLINICAL DATA:  Left lower quadrant abdominal pain. EXAM: CT ABDOMEN AND PELVIS WITH CONTRAST TECHNIQUE: Multidetector CT imaging of the abdomen and pelvis was performed using the standard protocol following bolus administration of intravenous contrast. RADIATION DOSE REDUCTION: This exam was performed according to the departmental dose-optimization program which includes automated exposure control, adjustment of the mA and/or kV according to patient size and/or use of iterative reconstruction technique. CONTRAST:  23mL OMNIPAQUE IOHEXOL 300 MG/ML  SOLN COMPARISON:  11/21/2020 FINDINGS: Lower chest: No acute abnormality. Hepatobiliary: No focal liver abnormality is seen. No gallstones, gallbladder wall thickening, or biliary dilatation. Pancreas: Unremarkable. No pancreatic ductal dilatation or surrounding inflammatory changes. Spleen: Normal in size without focal abnormality. Adrenals/Urinary Tract: Normal adrenal glands. No kidney stone, hydronephrosis or  mass. The urinary bladder is unremarkable. Stomach/Bowel: Stomach appears within normal limits. The appendix is visualized and appears normal. Colonic diverticulosis is identified. Focal area of inflammation with colonic wall thickening is noted involving the distal descending colon/proximal sigmoid colon. Small amount of free fluid noted is within the adjacent left distal pericolic gutter. Vascular/Lymphatic: Aortic atherosclerosis. No aneurysm. No abdominopelvic adenopathy identified. Reproductive: The uterus and adnexal structures are unremarkable. Other: No focal fluid collections identified to suggest abscess. No signs of pneumoperitoneum. Musculoskeletal: Well-circumscribed low-attenuation mass within the subcutaneous soft tissues of the right flank measures 3 cm and is compatible with a benign epidermal inclusion cyst, image 70/3. IMPRESSION: 1. Examination is positive for acute diverticulitis involving the distal descending colon/proximal sigmoid colon. No evidence for perforation or abscess. 2. Aortic Atherosclerosis (ICD10-I70.0). Electronically Signed   By: Kerby Moors M.D.   On: 04/13/2021 07:21   US PELVIC COMPLETE W TRANSVAGINAL AND TORSION R/O  Result Date: 04/13/2021 CLINICAL DATA:  Left pelvic pain since October 2022 EXAM: TRANSABDOMINAL ULTRASOUND OF PELVIS DOPPLER ULTRASOUND OF OVARIES TECHNIQUE: Transabdominal ultrasound examination of the pelvis was  performed including evaluation of the uterus, ovaries, adnexal regions, and pelvic cul-de-sac. Color and duplex Doppler ultrasound was utilized to evaluate blood flow to the ovaries. COMPARISON:  None. FINDINGS: Uterus Measurements: 6.1 x 4.4 x 3.5 cm = volume: 49 mL. No fibroids or other mass visualized. Endometrium Thickness: 10.  No focal abnormality visualized. Right ovary Measurements: 2.6 x 2.3 x 2.2 cm = volume: 7 mL. Normal appearance/no adnexal mass. Left ovary Measurements: 4.3 x 2.8 x 2.7 cm = volume: 18 mL. Normal appearance/no  adnexal mass. Pulsed Doppler evaluation demonstrates normal low-resistance arterial and venous waveforms in both ovaries. Other: Nabothian cysts in the cervix. IMPRESSION: Unremarkable pelvic ultrasound. Electronically Signed   By: Dahlia Bailiff M.D.   On: 04/13/2021 09:25    Procedures None  Medications Ordered in ED Medications  HYDROmorphone (DILAUDID) injection 0.5 mg (0 mg Intravenous Hold 04/13/21 0812)  oxyCODONE-acetaminophen (PERCOCET/ROXICET) 5-325 MG per tablet 1 tablet (1 tablet Oral Given 04/13/21 0526)  iohexol (OMNIPAQUE) 300 MG/ML solution 100 mL (75 mLs Intravenous Contrast Given 04/13/21 0710)  ondansetron (ZOFRAN) injection 4 mg (4 mg Intravenous Given 04/13/21 0921)  0.9 %  sodium chloride infusion ( Intravenous New Bag/Given 04/13/21 2863)    ED Course/ Medical Decision Making/ A&P   Porche Levins is a 46 year old female with past medical history of hypertension who presents with acute on chronic abdominal pain worse over the last 3 days.  She has a known history of diverticulitis, left ovarian cyst, left ovarian torsion.  Differential this time includes diverticulitis, ovarian torsion, appendicitis, pancreatitis.  Patient is afebrile with no leukocytosis, hypertensive though she does have a history of hypertension that she states she is not currently receiving medical management for.  Lipase within normal limits.  CT abdomen/pelvis showed evidence of acute diverticulitis involving the distal descending colon/proximal sigmoid colon, no evidence of perforation or abscess.  Interventions this point include a dose of Percocet which took the edge off of her pain however did not completely alleviate it. Additional dose of dilaudid 0.5 mg once ordered. Will obtain ultrasound to rule out ovarian torsion at this time.   0940: Pelvic ultrasound was normal. Patient did experience nausea and vomiting with dilaudid 0.5 mg so she was given .5L NS and Zofran with gradual improvement of these  her nausea.   1019: Patient stable for discharge home at this time. I have sent in Augmentin 875-125 mg BID for 10 days as well as Zofran 4 mg PRN nausea.  Medical Decision Making Amount and/or Complexity of Data Reviewed Radiology: ordered. ECG/medicine tests: ordered.  Risk Prescription drug management.   Final Clinical Impression(s) / ED Diagnoses Final diagnoses:  Diverticulitis    Rx / DC Orders ED Discharge Orders          Ordered    ondansetron (ZOFRAN) 4 MG tablet  Daily PRN        04/13/21 1020    amoxicillin-clavulanate (AUGMENTIN) 875-125 MG tablet  2 times daily        04/13/21 [place], [place], DO 04/13/21 1023    Elnora Morrison, MD 04/14/21 [phone]

## 2021-04-13 NOTE — ED Notes (Signed)
Patient transported to Ultrasound 

## 2021-04-13 NOTE — ED Notes (Signed)
Patient verbalizes understanding of discharge instructions. Opportunity for questioning and answers were provided. Armband removed by staff, pt discharged from ED and ambulated to lobby from ED to go home.

## 2021-04-13 NOTE — Discharge Instructions (Addendum)
Today you were evaluated for severe abdominal pain which we feel to be due to an acute diverticulitis flare. Fortunately your lab work was not concerning for infection but we did see evidence of diverticulitis on CT scan. You received medication for pain as well as fluids during your visit.  I will send you home with an antibiotic, Augmentin, that you will take two times daily for 10 days. I have also sent Zofran for nausea. If you do not feel better over the next several days after starting antibiotic therapy or begin having signs of infection like fever, are unable to keep any food or fluid down, or notice blood in your stool, please be re-evaluated promptly.

## 2021-04-13 NOTE — ED Notes (Signed)
Patient transported to CT 

## 2021-04-13 NOTE — ED Provider Triage Note (Signed)
Emergency Medicine Provider Triage Evaluation Note  Carla Scott , a 46 y.o. female  was evaluated in triage.  Pt complains of left lower abdominal and back pain.  Hx diverticulitis w/micro perforation in August 2022, feels like it has flared up again.  States trouble eating and some nausea.  No bloody stools.  Some intermittent fevers.  Supposed to see GI next month.  Review of Systems  Positive: LLQ pain, back pain Negative: Vomiting, diarrhea  Physical Exam  BP (!) 151/88    Pulse 88    Temp 98.1 F (36.7 C) (Oral)    Resp 16    SpO2 100%   Gen:   Awake, no distress   Resp:  Normal effort  MSK:   Moves extremities without difficulty  Other:  LLQ pain  Medical Decision Making  Medically screening exam initiated at 3:54 AM.  Appropriate orders placed.  Carla Scott was informed that the remainder of the evaluation will be completed by another provider, this initial triage assessment does not replace that evaluation, and the importance of remaining in the ED until their evaluation is complete.  LLQ pain, back pain.  Hx diverticulitis w/microperforation in august 2022.  Will check labs, UA, repeat CT scan.   Carla Pickett, PA-C 04/13/21 [phone]

## 2021-04-28 NOTE — Progress Notes (Addendum)
Subjective:    Carla Scott - 46 y.o. female MRN [id number]  Date of birth: Oct 31, 1975  HPI  Carla Scott is to establish care and hospital discharge follow-up.   Current issues and/or concerns: HOSPITAL DISCHARGE FOLLOW-UP: 04/13/2021 at [facility] per MD note: Carla Scott is a 46 year old female with past medical history of hypertension who presents with acute on chronic abdominal pain worse over the last 3 days.  She has a known history of diverticulitis, left ovarian cyst, left ovarian torsion.  Differential this time includes diverticulitis, ovarian torsion, appendicitis, pancreatitis.  Patient is afebrile with no leukocytosis, hypertensive though she does have a history of hypertension that she states she is not currently receiving medical management for.  Lipase within normal limits.  CT abdomen/pelvis showed evidence of acute diverticulitis involving the distal descending colon/proximal sigmoid colon, no evidence of perforation or abscess.   Interventions this point include a dose of Percocet which took the edge off of her pain however did not completely alleviate it. Additional dose of dilaudid 0.5 mg once ordered. Will obtain ultrasound to rule out ovarian torsion at this time.    0940: Pelvic ultrasound was normal. Patient did experience nausea and vomiting with dilaudid 0.5 mg so she was given .5L NS and Zofran with gradual improvement of these her nausea.    1019: Patient stable for discharge home at this time. I have sent in Augmentin 875-125 mg BID for 10 days as well as Zofran 4 mg PRN nausea.  05/03/2021: Feeling better since hospital discharge. Completed antibiotics prescribed at hospital discharge. However, feels symptoms of diverticulitis may be returning soon. Having stomach bloating for 2 days. No nausea, vomiting or additional red flag symptoms. Has lost weight over the course of time related to diverticulitis. Was 225 pounds currently 182 pounds.    Initially diagnosed with diverticulitis around 2017 while living in [place]. At that time was unaware the diagnosis is lifelong. She was doing well with management until 2022 when symptoms returned.   Concerned as one of the physicians at the hospital in [place] told her that she may need a colostomy in the future. She is not ready for a colostomy. Prior to hospital discharge she was educated on the the proper diet for diverticulitis. She was told to not eat meat, fruits, and vegetables. Currently she feels like the only thing she can eat is bread and drink water.   When discharged from hospital was offered pain medication but declined. Now would like to try course of pain medicine to hopefully decrease intensity of pain.  2. LEFT CYST FOLLOW-UP: Left lower stomach pain persisting. Described as a pulling pain. Having challenges with walking and sleeping related to pain. Radiation to bilateral lower back. Denies issues/concerns with urination. Menses and sitting up straight makes pain worse. Reclining improves pain.  3. HYPERTENSION: Currently taking: see medication list Have you taken your blood pressure medication today: []  Yes [x]  No  Med Adherence: []  Yes    [x]  No, self-discontinued Hydrochlorothiazide 12 months ago. Adherence with salt restriction (low-salt diet): []  Yes  [x]  No Exercise: Yes []  No [x]  Home Monitoring?: [x]  Yes    []  No Monitoring Frequency: []  Yes    [x]  No Home BP results range: []  Yes    [x]  No Smoking [x]  Yes, 0.5 pack daily. Ready to quit smoking. However, medications (specifically Wellbutrin) for smoking cessation caused severe side effects causing her to become violent. Tried nicotine patches in the past helped, needs refill.  Has quit smoking in the past. Usually after 3 to 4 days of no smoking becomes upset. SOB? []  Yes    [x]  No Chest Pain?: Sometimes  Headaches?: [x]  Yes, taking over-the-counter Ibuprofen to help. Denies worst headache of  life. Comments: History of heart murmur.  4. ANXIETY DEPRESSION: Primarily related to life and family stressors. Worries and cries about others a lot. Reports has a big heart and cares too much even when others do her wrong. Was taking Zoloft in the past and self-discontinued because making symptoms worse. Feels stronger mentally in comparison to a few years ago. Had counseling in the past and open to trying again. Reports family history of mental health disorders. Denies thoughts of self-harm, suicidal ideations, and homicidal ideations.   5. MOLE: Within the last 2 years mole on right abdomen has increased significantly in size. Endorses itching. Denies additional red flag symptoms.   ROS per HPI    Health Maintenance:  Health Maintenance Due  Topic Date Due   Hepatitis C Screening  Never done   TETANUS/TDAP  Never done   PAP SMEAR-Modifier  Never done   INFLUENZA VACCINE  Never done    Past Medical History: Patient Active Problem List   Diagnosis Date Noted   Ovarian cyst 05/04/2021   Essential (primary) hypertension 05/04/2021   Anxiety and depression 05/04/2021   Diverticulitis large intestine 11/21/2020   Social History   reports that she has been smoking cigarettes. She has a 14.50 pack-year smoking history. She has never been exposed to tobacco smoke. She has never used smokeless tobacco. She reports that she does not currently use alcohol. She reports current drug use. Drug: Marijuana.   Family History  family history is not on file.   Medications: reviewed and updated   Objective:   Physical Exam BP (!) 164/94    Pulse 81    Resp 18    Ht 5\' 6"  (1.676 m)    Wt 182 lb 4 oz (82.7 kg)    LMP 04/27/2021 (Approximate)    SpO2 98%    BMI 29.42 kg/m   Physical Exam HENT:     Head: Normocephalic and atraumatic.  Eyes:     Extraocular Movements: Extraocular movements intact.     Conjunctiva/sclera: Conjunctivae normal.     Pupils: Pupils are equal, round, and  reactive to light.  Cardiovascular:     Rate and Rhythm: Normal rate and regular rhythm.     Pulses: Normal pulses.     Heart sounds: Normal heart sounds.  Pulmonary:     Effort: Pulmonary effort is normal.     Breath sounds: Normal breath sounds.  Abdominal:     Tenderness: There is abdominal tenderness.  Musculoskeletal:     Cervical back: Normal range of motion and neck supple.  Skin:    Comments: Mole right abdomen large in size. No evidence of drainage or compromised skin integrity.  Neurological:     General: No focal deficit present.     Mental Status: She is alert and oriented to person, place, and time.  Psychiatric:        Mood and Affect: Mood normal.        Behavior: Behavior normal.     Assessment & Plan:  1. Encounter to establish care: - Patient presents today to establish care.  - Return for annual physical examination, labs, and health maintenance. Arrive fasting meaning having no food for at least 8 hours prior to appointment. You may have  only water or black coffee. Please take scheduled medications as normal.  2. Hospital discharge follow-up: - Reviewed hospital course, current medications, ensured proper follow-up in place, and addressed concerns.   3. Diverticulitis: - Tramadol as prescribed. Counseled on medication compliance and adverse effects. - I did check the [facility] prescription drug database and found no frequent prescribers of opiates or evidence of aberrant behavior. - Referral to Gastroenterology for further evaluation and management.  - Ambulatory referral to Gastroenterology - traMADol (ULTRAM) 50 MG tablet; Take 1 tablet (50 mg total) by mouth every 12 (twelve) hours as needed for up to 5 days.  Dispense: 10 tablet; Refill: 0  4. Left ovarian cyst: 5. Torsion of left ovary: - Referral to Gynecology for further evaluation and management. - Ambulatory referral to Gynecology  6. Essential (primary) hypertension: - Blood pressure not  at goal during today's visit. Patient asymptomatic without chest pressure, chest pain, palpitations, shortness of breath, worst headache of life, and any additional red flag symptoms. - Resume Hydrochlorothiazide as prescribed.  - Counseled on blood pressure goal of less than 130/80, low-sodium, DASH diet, medication compliance, 150 minutes of moderate intensity exercise per week as tolerated. Discussed medication compliance, adverse effects. - Follow-up with primary provider in 2 weeks or sooner if needed.  - hydrochlorothiazide (HYDRODIURIL) 25 MG tablet; Take 1 tablet (25 mg total) by mouth daily.  Dispense: 30 tablet; Refill: 0  7. Anxiety and depression: - Patient denies thoughts of self-harm, suicidal ideations, homicidal ideations. - Will defer resuming antidepressant as this caused side effects in the past.  - Referral to Dennison Mascot, LCSW for counseling and community resources.  - Referral to Psychiatry for further evaluation and management.  - Ambulatory referral to Psychiatry  8. Encounter for smoking cessation counseling: - Counseled to quit.  Discussed health risk associated with smoking and cessation methods.  - Begin nicotine polacrilex and nicotine patch as prescribed. Discussed medication compliance and adverse effects. - Follow-up with primary provider in 4 weeks or sooner if needed.  - nicotine polacrilex (NICORETTE) 2 MG gum; Chew 1 piece of gum every 1 to 2 hours (maximum: 24 pieces/day).  Dispense: 100 tablet; Refill: 2 - nicotine (NICODERM CQ) 21 mg/24hr patch; Place 1 patch (21 mg total) onto the skin daily.  Dispense: 7 patch; Refill: 4  9. Change in mole: - Referral to Dermatology for further evaluation and management.  - Ambulatory referral to Dermatology    Patient was given clear instructions to go to Emergency Department or return to medical center if symptoms don't improve, worsen, or new problems develop.The patient verbalized understanding.  I discussed  the assessment and treatment plan with the patient. The patient was provided an opportunity to ask questions and all were answered. The patient agreed with the plan and demonstrated an understanding of the instructions.   The patient was advised to call back or seek an in-person evaluation if the symptoms worsen or if the condition fails to improve as anticipated.    Durene Fruits, NP 05/04/2021, 7:54 AM Primary Care at [facility]

## 2021-05-03 ENCOUNTER — Ambulatory Visit: Payer: Self-pay | Admitting: Family

## 2021-05-03 ENCOUNTER — Other Ambulatory Visit: Payer: Self-pay

## 2021-05-03 ENCOUNTER — Encounter: Payer: Self-pay | Admitting: Family

## 2021-05-03 VITALS — BP 164/94 | HR 81 | Resp 18 | Ht 66.0 in | Wt 182.2 lb

## 2021-05-03 DIAGNOSIS — I10 Essential (primary) hypertension: Secondary | ICD-10-CM

## 2021-05-03 DIAGNOSIS — N83512 Torsion of left ovary and ovarian pedicle: Secondary | ICD-10-CM

## 2021-05-03 DIAGNOSIS — F419 Anxiety disorder, unspecified: Secondary | ICD-10-CM

## 2021-05-03 DIAGNOSIS — K5792 Diverticulitis of intestine, part unspecified, without perforation or abscess without bleeding: Secondary | ICD-10-CM

## 2021-05-03 DIAGNOSIS — Z7689 Persons encountering health services in other specified circumstances: Secondary | ICD-10-CM

## 2021-05-03 DIAGNOSIS — Z09 Encounter for follow-up examination after completed treatment for conditions other than malignant neoplasm: Secondary | ICD-10-CM

## 2021-05-03 DIAGNOSIS — N83202 Unspecified ovarian cyst, left side: Secondary | ICD-10-CM

## 2021-05-03 DIAGNOSIS — F32A Depression, unspecified: Secondary | ICD-10-CM

## 2021-05-03 DIAGNOSIS — Z716 Tobacco abuse counseling: Secondary | ICD-10-CM

## 2021-05-03 DIAGNOSIS — D229 Melanocytic nevi, unspecified: Secondary | ICD-10-CM

## 2021-05-03 MED ORDER — NICOTINE POLACRILEX 2 MG MT GUM: CHEWING_GUM | OROMUCOSAL | 2 refills | Status: AC

## 2021-05-03 MED ORDER — HYDROCHLOROTHIAZIDE 25 MG PO TABS: 25.0000 mg | ORAL_TABLET | Freq: Every day | ORAL | 0 refills | Status: DC

## 2021-05-03 MED ORDER — NICOTINE 21 MG/24HR TD PT24: 21.0000 mg | MEDICATED_PATCH | Freq: Every day | TRANSDERMAL | 4 refills | Status: AC

## 2021-05-03 MED ORDER — TRAMADOL HCL 50 MG PO TABS: 50.0000 mg | ORAL_TABLET | Freq: Two times a day (BID) | ORAL | 0 refills | Status: AC | PRN

## 2021-05-03 NOTE — Progress Notes (Signed)
Left side cyst since August In between breast X 3 days Bottom on back radiates to bra

## 2021-05-03 NOTE — Patient Instructions (Addendum)
Thank you for choosing Primary Care at [facility] for your medical home!    Carla Scott was seen by Camillia Herter, NP today.   Carla Scott's primary care provider is Durene Fruits, NP.   For the best care possible,  you should try to see Durene Fruits, NP whenever you come to clinic.   We look forward to seeing you again soon!  If you have any questions about your visit today,  please call us at [phone]  Or feel free to reach your provider via [facility].    Diverticulitis Diverticulitis is when small pouches in your colon (large intestine) get infected or swollen. This causes pain in the belly (abdomen) and watery poop (diarrhea). These pouches are called diverticula. The pouches form in people who have a condition called diverticulosis. What are the causes? This condition may be caused by poop (stool) that gets trapped in the pouches in your colon. The poop lets germs (bacteria) grow in the pouches. This causes the infection. What increases the risk? You are more likely to get this condition if you have small pouches in your colon. The risk is higher if: You are overweight or very overweight (obese). You do not exercise enough. You drink alcohol. You smoke or use products with tobacco in them. You eat a diet that has a lot of red meat such as beef, pork, or lamb. You eat a diet that does not have enough fiber in it. You are older than 45 years of age. What are the signs or symptoms? Pain in the belly. Pain is often on the left side, but it may be in other areas. Fever and feeling cold. Feeling like you may vomit. Vomiting. Having cramps. Feeling full. Changes to how often you poop. Blood in your poop. How is this treated? Most cases are treated at home by: Taking over-the-counter pain medicines. Following a clear liquid diet. Taking antibiotic medicines. Resting. Very bad cases may need to be treated at a hospital. This may include: Not eating or  drinking. Taking prescription pain medicine. Getting antibiotic medicines through an IV tube. Getting fluid and food through an IV tube. Having surgery. When you are feeling better, your doctor may tell you to have a test to check your colon (colonoscopy). Follow these instructions at home: Medicines Take over-the-counter and prescription medicines only as told by your doctor. These include: Antibiotics. Pain medicines. Fiber pills. Probiotics. Stool softeners. If you were prescribed an antibiotic medicine, take it as told by your doctor. Do not stop taking the antibiotic even if you start to feel better. Ask your doctor if the medicine prescribed to you requires you to avoid driving or using machinery. Eating and drinking  Follow a diet as told by your doctor. When you feel better, your doctor may tell you to change your diet. You may need to eat a lot of fiber. Fiber makes it easier to poop (have a bowel movement). Foods with fiber include: Berries. Beans. Lentils. Green vegetables. Avoid eating red meat. General instructions Do not use any products that contain nicotine or tobacco, such as cigarettes, e-cigarettes, and chewing tobacco. If you need help quitting, ask your doctor. Exercise 3 or more times a week. Try to get 30 minutes each time. Exercise enough to sweat and make your heart beat faster. Keep all follow-up visits as told by your doctor. This is important. Contact a doctor if: Your pain does not get better. You are not pooping like normal. Get help right  away if: Your pain gets worse. Your symptoms do not get better. Your symptoms get worse very fast. You have a fever. You vomit more than one time. You have poop that is: Bloody. Black. Tarry. Summary This condition happens when small pouches in your colon get infected or swollen. Take medicines only as told by your doctor. Follow a diet as told by your doctor. Keep all follow-up visits as told by your  doctor. This is important. This information is not intended to replace advice given to you by your health care provider. Make sure you discuss any questions you have with your health care provider. Document Revised: 12/23/2018 Document Reviewed: 12/23/2018 Elsevier Patient Education  2022 [employer].

## 2021-05-04 DIAGNOSIS — N83209 Unspecified ovarian cyst, unspecified side: Secondary | ICD-10-CM | POA: Insufficient documentation

## 2021-05-04 DIAGNOSIS — I10 Essential (primary) hypertension: Secondary | ICD-10-CM | POA: Insufficient documentation

## 2021-05-04 DIAGNOSIS — F419 Anxiety disorder, unspecified: Secondary | ICD-10-CM | POA: Insufficient documentation

## 2021-05-11 NOTE — Progress Notes (Signed)
Erroneous encounter

## 2021-05-17 ENCOUNTER — Encounter: Payer: 59 | Admitting: Family

## 2021-06-10 NOTE — Progress Notes (Signed)
Erroneous encounter

## 2021-06-15 ENCOUNTER — Encounter: Payer: Self-pay | Admitting: Family

## 2021-06-15 DIAGNOSIS — Z1231 Encounter for screening mammogram for malignant neoplasm of breast: Secondary | ICD-10-CM

## 2021-06-15 DIAGNOSIS — Z124 Encounter for screening for malignant neoplasm of cervix: Secondary | ICD-10-CM

## 2021-06-15 DIAGNOSIS — Z1329 Encounter for screening for other suspected endocrine disorder: Secondary | ICD-10-CM

## 2021-06-15 DIAGNOSIS — Z0000 Encounter for general adult medical examination without abnormal findings: Secondary | ICD-10-CM

## 2021-06-15 DIAGNOSIS — Z1159 Encounter for screening for other viral diseases: Secondary | ICD-10-CM

## 2021-06-15 DIAGNOSIS — Z716 Tobacco abuse counseling: Secondary | ICD-10-CM

## 2021-06-15 DIAGNOSIS — Z131 Encounter for screening for diabetes mellitus: Secondary | ICD-10-CM

## 2021-06-15 DIAGNOSIS — Z13220 Encounter for screening for lipoid disorders: Secondary | ICD-10-CM

## 2021-06-15 DIAGNOSIS — Z113 Encounter for screening for infections with a predominantly sexual mode of transmission: Secondary | ICD-10-CM

## 2021-06-15 DIAGNOSIS — I10 Essential (primary) hypertension: Secondary | ICD-10-CM

## 2021-08-02 ENCOUNTER — Ambulatory Visit (HOSPITAL_COMMUNITY): Payer: Self-pay | Admitting: Licensed Clinical Social Worker

## 2021-09-14 ENCOUNTER — Emergency Department (HOSPITAL_BASED_OUTPATIENT_CLINIC_OR_DEPARTMENT_OTHER)
Admission: EM | Admit: 2021-09-14 | Discharge: 2021-09-14 | Disposition: A | Discharge status: Home / Self Care | Payer: Self-pay | Attending: Emergency Medicine | Admitting: Emergency Medicine

## 2021-09-14 ENCOUNTER — Emergency Department (HOSPITAL_BASED_OUTPATIENT_CLINIC_OR_DEPARTMENT_OTHER): Payer: Self-pay

## 2021-09-14 ENCOUNTER — Other Ambulatory Visit: Payer: Self-pay

## 2021-09-14 ENCOUNTER — Encounter (HOSPITAL_BASED_OUTPATIENT_CLINIC_OR_DEPARTMENT_OTHER): Payer: Self-pay

## 2021-09-14 DIAGNOSIS — E279 Disorder of adrenal gland, unspecified: Secondary | ICD-10-CM | POA: Insufficient documentation

## 2021-09-14 DIAGNOSIS — E278 Other specified disorders of adrenal gland: Secondary | ICD-10-CM

## 2021-09-14 DIAGNOSIS — R110 Nausea: Secondary | ICD-10-CM | POA: Insufficient documentation

## 2021-09-14 DIAGNOSIS — R1084 Generalized abdominal pain: Secondary | ICD-10-CM | POA: Insufficient documentation

## 2021-09-14 DIAGNOSIS — B3731 Acute candidiasis of vulva and vagina: Secondary | ICD-10-CM | POA: Insufficient documentation

## 2021-09-14 DIAGNOSIS — Z79899 Other long term (current) drug therapy: Secondary | ICD-10-CM | POA: Insufficient documentation

## 2021-09-14 DIAGNOSIS — N9489 Other specified conditions associated with female genital organs and menstrual cycle: Secondary | ICD-10-CM | POA: Insufficient documentation

## 2021-09-14 DIAGNOSIS — N281 Cyst of kidney, acquired: Secondary | ICD-10-CM | POA: Insufficient documentation

## 2021-09-14 DIAGNOSIS — N898 Other specified noninflammatory disorders of vagina: Secondary | ICD-10-CM | POA: Insufficient documentation

## 2021-09-14 DIAGNOSIS — R109 Unspecified abdominal pain: Secondary | ICD-10-CM

## 2021-09-14 HISTORY — DX: Diverticulitis of intestine, part unspecified, without perforation or abscess without bleeding: K57.92

## 2021-09-14 LAB — COMPREHENSIVE METABOLIC PANEL
ALT: 8 U/L (ref 0–44)
AST: 11 U/L — ABNORMAL LOW (ref 15–41)
Albumin: 4.4 g/dL (ref 3.5–5.0)
Alkaline Phosphatase: 50 U/L (ref 38–126)
Anion gap: 8 (ref 5–15)
BUN: 8 mg/dL (ref 6–20)
CO2: 27 mmol/L (ref 22–32)
Calcium: 9.9 mg/dL (ref 8.9–10.3)
Chloride: 105 mmol/L (ref 98–111)
Creatinine, Ser: 1.15 mg/dL — ABNORMAL HIGH (ref 0.44–1.00)
GFR, Estimated: >60 mL/min (ref >60)
Glucose, Bld: 82 mg/dL (ref 70–99)
Potassium: 3.7 mmol/L (ref 3.5–5.1)
Sodium: 140 mmol/L (ref 135–145)
Total Bilirubin: 0.5 mg/dL (ref 0.3–1.2)
Total Protein: 7.4 g/dL (ref 6.5–8.1)

## 2021-09-14 LAB — URINALYSIS, ROUTINE W REFLEX MICROSCOPIC
Bilirubin Urine: NEGATIVE
Glucose, UA: NEGATIVE mg/dL
Hgb urine dipstick: NEGATIVE
Ketones, ur: NEGATIVE mg/dL
Leukocytes,Ua: NEGATIVE
Nitrite: NEGATIVE
Specific Gravity, Urine: >1.046 — ABNORMAL HIGH (ref 1.005–1.030)
pH: 7 (ref 5.0–8.0)

## 2021-09-14 LAB — CBC
HCT: 37.1 % (ref 36.0–46.0)
Hemoglobin: 11.7 g/dL — ABNORMAL LOW (ref 12.0–15.0)
MCH: 30.1 pg (ref 26.0–34.0)
MCHC: 31.5 g/dL (ref 30.0–36.0)
MCV: 95.4 fL (ref 80.0–100.0)
Platelets: 194 10*3/uL (ref 150–400)
RBC: 3.89 MIL/uL (ref 3.87–5.11)
RDW: 12.7 % (ref 11.5–15.5)
WBC: 6.5 10*3/uL (ref 4.0–10.5)
nRBC: 0 % (ref 0.0–0.2)

## 2021-09-14 LAB — WET PREP, GENITAL
Clue Cells Wet Prep HPF POC: NONE SEEN
Sperm: NONE SEEN
Trich, Wet Prep: NONE SEEN
WBC, Wet Prep HPF POC: <10 (ref <10)

## 2021-09-14 LAB — PREGNANCY, URINE: Preg Test, Ur: NEGATIVE

## 2021-09-14 LAB — HCG, QUANTITATIVE, PREGNANCY: hCG, Beta Chain, Quant, S: 1 m[IU]/mL (ref <5)

## 2021-09-14 LAB — LIPASE, BLOOD: Lipase: 39 U/L (ref 11–51)

## 2021-09-14 MED ORDER — OXYCODONE HCL 5 MG PO TABS: 5.0000 mg | ORAL_TABLET | Freq: Four times a day (QID) | ORAL | 0 refills | Status: DC | PRN

## 2021-09-14 MED ORDER — IOHEXOL 300 MG/ML  SOLN
100.0000 mL | Freq: Once | INTRAMUSCULAR | Status: AC | PRN
  Administered 2021-09-14: 80 mL via INTRAVENOUS

## 2021-09-14 MED ORDER — MORPHINE SULFATE (PF) 4 MG/ML IV SOLN
4.0000 mg | Freq: Once | INTRAVENOUS | Status: AC
Start: 2021-09-14 — End: 2021-09-14
  Administered 2021-09-14: 4 mg via INTRAVENOUS
  Filled 2021-09-14: qty 1

## 2021-09-14 MED ORDER — FLUCONAZOLE 200 MG PO TABS: 200.0000 mg | ORAL_TABLET | Freq: Every day | ORAL | 0 refills | Status: AC

## 2021-09-14 MED ORDER — ONDANSETRON HCL 4 MG/2ML IJ SOLN
4.0000 mg | Freq: Once | INTRAMUSCULAR | Status: AC
Start: 2021-09-14 — End: 2021-09-14
  Administered 2021-09-14: 4 mg via INTRAVENOUS
  Filled 2021-09-14: qty 2

## 2021-09-14 NOTE — ED Triage Notes (Signed)
She c/o abd. Discomfort and bloating which feels very similar to an episode last year at which time she was dx with diverticulitis. She also mentions that she may have "a vaginal yeast infection". She denies fever and tells me her bowel movements have been "normal".

## 2021-09-14 NOTE — ED Provider Notes (Signed)
[place] EMERGENCY DEPT Provider Note   CSN: [id number] Arrival date & time: 09/14/21  4315     History  Chief Complaint  Patient presents with   Abdominal Pain    Carla Scott is a 46 y.o. female.  Patient is a 46 year old female presentin for abdominal pain.  Patient admits to lower abdominal pain including right side, suprapubic region, left side.  Admits to radiation to the right flank.  Pain is severe, constant, associated with nausea without vomiting and bloating.  Denies any fevers, chills, diarrhea.  Patient diagnosed last year with diverticulitis of the ascending, transverse, and descending colon with microperforation.  No surgical intervention was required at this time.  Patient then was seen again in January of this year with repeat diverticulitis.  Patient also admits to increased vaginal discharge, vaginal itching, and odor.  The history is provided by the patient. No language interpreter was used.  Abdominal Pain Associated symptoms: nausea and vaginal discharge   Associated symptoms: no chest pain, no chills, no cough, no dysuria, no fever, no hematuria, no shortness of breath, no sore throat and no vomiting        Home Medications Prior to Admission medications   Medication Sig Start Date End Date Taking? Authorizing Provider  fluconazole (DIFLUCAN) 200 MG tablet Take 1 tablet (200 mg total) by mouth daily for 2 days. 09/14/21 4/00/86 Yes Campbell Stall P, DO  hydrochlorothiazide (HYDRODIURIL) 25 MG tablet Take 1 tablet (25 mg total) by mouth daily. 05/03/21 09/14/21 Yes Minette Brine, Amy J, NP  ibuprofen (ADVIL) 200 MG tablet Take 200 mg by mouth every 6 (six) hours as needed.   Yes [provider]  oxyCODONE (ROXICODONE) 5 MG immediate release tablet Take 1 tablet (5 mg total) by mouth every 6 (six) hours as needed for up to 6 doses for severe pain. 7/61/95  Yes Campbell Stall P, DO  acetaminophen (TYLENOL) 325 MG tablet Take 2 tablets (650 mg  total) by mouth every 6 (six) hours as needed for mild pain, fever or headache (or temp > 100). 11/24/20   Norm Parcel, PA-C  nicotine (NICODERM CQ) 21 mg/24hr patch Place 1 patch (21 mg total) onto the skin daily. 05/03/21   Camillia Herter, NP  nicotine polacrilex (NICORETTE) 2 MG gum Chew 1 piece of gum every 1 to 2 hours (maximum: 24 pieces/day). 05/03/21   Camillia Herter, NP  ondansetron (ZOFRAN) 4 MG tablet Take 1 tablet (4 mg total) by mouth daily as needed for nausea or vomiting. 04/13/21 04/13/22  Farrel Gordon, DO      Allergies    Other and Wheat bran    Review of Systems   Review of Systems  Constitutional:  Negative for chills and fever.  HENT:  Negative for ear pain and sore throat.   Eyes:  Negative for pain and visual disturbance.  Respiratory:  Negative for cough and shortness of breath.   Cardiovascular:  Negative for chest pain and palpitations.  Gastrointestinal:  Positive for abdominal pain and nausea. Negative for vomiting.  Genitourinary:  Positive for vaginal discharge. Negative for dysuria, hematuria, pelvic pain and vaginal pain.  Musculoskeletal:  Negative for arthralgias and back pain.  Skin:  Negative for color change and rash.  Neurological:  Negative for seizures and syncope.  All other systems reviewed and are negative.   Physical Exam Updated Vital Signs BP (!) 153/86   Pulse 60   Temp 98.9 F (37.2 C) (Oral)   Resp 14  LMP 08/13/2021 (Approximate)   SpO2 97%  Physical Exam Vitals and nursing note reviewed. Exam conducted with a chaperone present.  Constitutional:      General: She is not in acute distress.    Appearance: She is well-developed.  HENT:     Head: Normocephalic and atraumatic.  Eyes:     Conjunctiva/sclera: Conjunctivae normal.  Cardiovascular:     Rate and Rhythm: Normal rate and regular rhythm.     Heart sounds: No murmur heard. Pulmonary:     Effort: Pulmonary effort is normal. No respiratory distress.     Breath sounds:  Normal breath sounds.  Abdominal:     Palpations: Abdomen is soft.     Tenderness: There is generalized abdominal tenderness. There is no guarding or rebound.  Genitourinary:    Vagina: Normal.     Cervix: Discharge (thick white, copious) present.  Musculoskeletal:        General: No swelling.     Cervical back: Neck supple.  Skin:    General: Skin is warm and dry.     Capillary Refill: Capillary refill takes less than 2 seconds.  Neurological:     Mental Status: She is alert.  Psychiatric:        Mood and Affect: Mood normal.     ED Results / Procedures / Treatments   Labs (all labs ordered are listed, but only abnormal results are displayed) Labs Reviewed  WET PREP, GENITAL - Abnormal; Notable for the following components:      Result Value   Yeast Wet Prep HPF POC PRESENT (*)    All other components within normal limits  COMPREHENSIVE METABOLIC PANEL - Abnormal; Notable for the following components:   Creatinine, Ser 1.15 (*)    AST 11 (*)    All other components within normal limits  CBC - Abnormal; Notable for the following components:   Hemoglobin 11.7 (*)    All other components within normal limits  URINALYSIS, ROUTINE W REFLEX MICROSCOPIC - Abnormal; Notable for the following components:   Specific Gravity, Urine >1.046 (*)    Protein, ur TRACE (*)    All other components within normal limits  LIPASE, BLOOD  PREGNANCY, URINE  HCG, QUANTITATIVE, PREGNANCY  GC/CHLAMYDIA PROBE AMP ([place]) NOT AT [facility]    EKG None  Radiology CT ABDOMEN PELVIS W CONTRAST  Result Date: 09/14/2021 CLINICAL DATA:  BILATERAL lower quadrant and RIGHT flank pain, history of diverticulitis and prior ovarian torsion EXAM: CT ABDOMEN AND PELVIS WITH CONTRAST TECHNIQUE: Multidetector CT imaging of the abdomen and pelvis was performed using the standard protocol following bolus administration of intravenous contrast. RADIATION DOSE REDUCTION: This exam was performed according to the  departmental dose-optimization program which includes automated exposure control, adjustment of the mA and/or kV according to patient size and/or use of iterative reconstruction technique. CONTRAST:  69m OMNIPAQUE IOHEXOL 300 MG/ML SOLN IV. No oral contrast. COMPARISON:  04/13/2021 FINDINGS: Lower chest: Minimal dependent bibasilar atelectasis Hepatobiliary: Contracted gallbladder.  Liver normal appearance. Pancreas: Normal appearance Spleen: Normal appearance.  Small splenule. Adrenals/Urinary Tract: 11 x 8 mm RIGHT adrenal nodule, measuring 64 HU; follow-up adrenal washout CT recommended in 1 year to assess. Kidneys, ureters, and bladder normal appearance Stomach/Bowel: Stomach distended by food debris. Normal appendix. Minimal distal colonic diverticulosis without evidence of diverticulitis. Large and small bowel loops otherwise unremarkable. Vascular/Lymphatic: Atherosclerotic calcifications aorta and iliac arteries without aneurysm. Vascular structures patent. No adenopathy. Reproductive: Uterus and ovaries normal appearance Other: Subcutaneous cystic mass  lateral RIGHT pelvis 3.3 x 2.2 cm unchanged question sebaceous cyst versus epidermal inclusion cyst. No free air or free fluid. No hernia or inflammatory process. Musculoskeletal: Unremarkable IMPRESSION: Minimal distal colonic diverticulosis without evidence of diverticulitis. No acute intra-abdominal or intrapelvic abnormalities. 11 x 8 mm RIGHT adrenal nodule; follow-up adrenal washout CT recommended in 1 year. Stable subcutaneous cystic mass lateral RIGHT pelvis 3.3 x 2.2 cm question sebaceous cyst versus epidermal inclusion cyst. Aortic Atherosclerosis (ICD10-I70.0). Electronically Signed   By: Lavonia Dana M.D.   On: 09/14/2021 13:40    Procedures Procedures    Medications Ordered in ED Medications  morphine (PF) 4 MG/ML injection 4 mg (4 mg Intravenous Given 09/14/21 1045)  ondansetron (ZOFRAN) injection 4 mg (4 mg Intravenous Given 09/14/21  1043)  iohexol (OMNIPAQUE) 300 MG/ML solution 100 mL (80 mLs Intravenous Contrast Given 09/14/21 1320)    ED Course/ Medical Decision Making/ A&P                           Medical Decision Making Amount and/or Complexity of Data Reviewed Labs: ordered. Radiology: ordered.  Risk Prescription drug management.   45:56 PM 46 year old female presentin for abdominal pain.  Patient is alert and oriented x3, no acute distress, afebrile, stable vital signs.  Physical exam demonstrates soft abdomen with generalized tenderness.  Stable laboratory studies including liver profile, lipase, and renal function.  Urine analysis demonstrates no urinary tract infection.  No hematuria.  Serum pregnancy negative.  CT abdomen demonstrates diverticulosis without diverticulitis.  Adrenal cyst and nodule seen.  Patient recommended for close follow-up with primary care physician if pain continues with repeat imaging of adrenal nodule in the next 6 months.  Right-sided flank pain is reproducible in nature, consider musculoskeletal pain a possibility.  Patient pain improved with medication for pain control emergency department.  Med sent to pharmacy.  Patient also admits to increased vaginal discharge and itching.  Pelvic exam demonstrates thick white vaginal discharge concerning for yeast.  Wet mount demonstrates used only.  Fluconazole sent to pharmacy.  Patient in no distress and overall condition improved here in the ED. Detailed discussions were had with the patient regarding current findings, and need for close f/u with PCP or on call doctor. The patient has been instructed to return immediately if the symptoms worsen in any way for re-evaluation. Patient verbalized understanding and is in agreement with current care plan. All questions answered prior to discharge.         Final Clinical Impression(s) / ED Diagnoses Final diagnoses:  Vaginal yeast infection  Generalized abdominal pain  Flank pain   Renal cyst, right  Adrenal nodule ([place])    Rx / DC Orders ED Discharge Orders          Ordered    fluconazole (DIFLUCAN) 200 MG tablet  Daily        09/14/21 1416    oxyCODONE (ROXICODONE) 5 MG immediate release tablet  Every 6 hours PRN        09/14/21 1416              Lianne Cure, DO 25/95/63 1418

## 2021-09-14 NOTE — ED Notes (Signed)
Patient transported to CT 

## 2021-09-14 NOTE — Discharge Instructions (Signed)
CT scan results demonstrated a right-sided adrenal cyst and right adrenal nodule. Please follow-up with your primary care physician in the next 6 months for repeat imaging of adrenal nodule or sooner if pain continues.  Medication sent to pharmacy for pain control and treatment of yeast infection.

## 2021-09-15 LAB — GC/CHLAMYDIA PROBE AMP (~~LOC~~) NOT AT ARMC
Chlamydia: NEGATIVE
Comment: NEGATIVE
Comment: NORMAL
Neisseria Gonorrhea: NEGATIVE

## 2021-11-21 ENCOUNTER — Encounter: Payer: Self-pay | Admitting: Obstetrics and Gynecology

## 2021-11-23 NOTE — Progress Notes (Signed)
Patient did not keep her ED GYN referral appointment for 11/21/2021.  Durene Romans MD Attending Center for [employer])

## 2022-11-28 ENCOUNTER — Encounter (HOSPITAL_BASED_OUTPATIENT_CLINIC_OR_DEPARTMENT_OTHER): Payer: Self-pay

## 2022-11-28 ENCOUNTER — Other Ambulatory Visit: Payer: Self-pay

## 2022-11-28 ENCOUNTER — Emergency Department (HOSPITAL_BASED_OUTPATIENT_CLINIC_OR_DEPARTMENT_OTHER)
Admission: EM | Admit: 2022-11-28 | Discharge: 2022-11-28 | Disposition: A | Discharge status: Home / Self Care | Payer: Self-pay | Attending: Emergency Medicine | Admitting: Emergency Medicine

## 2022-11-28 ENCOUNTER — Emergency Department (HOSPITAL_BASED_OUTPATIENT_CLINIC_OR_DEPARTMENT_OTHER): Payer: Self-pay | Admitting: Radiology

## 2022-11-28 DIAGNOSIS — J069 Acute upper respiratory infection, unspecified: Secondary | ICD-10-CM | POA: Insufficient documentation

## 2022-11-28 DIAGNOSIS — I10 Essential (primary) hypertension: Secondary | ICD-10-CM

## 2022-11-28 DIAGNOSIS — Z1152 Encounter for screening for COVID-19: Secondary | ICD-10-CM | POA: Insufficient documentation

## 2022-11-28 DIAGNOSIS — J40 Bronchitis, not specified as acute or chronic: Secondary | ICD-10-CM | POA: Insufficient documentation

## 2022-11-28 DIAGNOSIS — D72829 Elevated white blood cell count, unspecified: Secondary | ICD-10-CM | POA: Insufficient documentation

## 2022-11-28 LAB — CBC
HCT: 34.7 % — ABNORMAL LOW (ref 36.0–46.0)
Hemoglobin: 11.7 g/dL — ABNORMAL LOW (ref 12.0–15.0)
MCH: 31.2 pg (ref 26.0–34.0)
MCHC: 33.7 g/dL (ref 30.0–36.0)
MCV: 92.5 fL (ref 80.0–100.0)
Platelets: 179 10*3/uL (ref 150–400)
RBC: 3.75 MIL/uL — ABNORMAL LOW (ref 3.87–5.11)
RDW: 13.6 % (ref 11.5–15.5)
WBC: 10.9 10*3/uL — ABNORMAL HIGH (ref 4.0–10.5)
nRBC: 0 % (ref 0.0–0.2)

## 2022-11-28 LAB — BASIC METABOLIC PANEL
Anion gap: 6 (ref 5–15)
BUN: 9 mg/dL (ref 6–20)
CO2: 21 mmol/L — ABNORMAL LOW (ref 22–32)
Calcium: 9 mg/dL (ref 8.9–10.3)
Chloride: 108 mmol/L (ref 98–111)
Creatinine, Ser: 0.93 mg/dL (ref 0.44–1.00)
GFR, Estimated: >60 mL/min (ref >60)
Glucose, Bld: 89 mg/dL (ref 70–99)
Potassium: 4.1 mmol/L (ref 3.5–5.1)
Sodium: 135 mmol/L (ref 135–145)

## 2022-11-28 LAB — TROPONIN I (HIGH SENSITIVITY): Troponin I (High Sensitivity): 2 ng/L (ref <18)

## 2022-11-28 LAB — PREGNANCY, URINE: Preg Test, Ur: NEGATIVE

## 2022-11-28 LAB — SARS CORONAVIRUS 2 BY RT PCR: SARS Coronavirus 2 by RT PCR: NEGATIVE

## 2022-11-28 MED ORDER — ALBUTEROL SULFATE HFA 108 (90 BASE) MCG/ACT IN AERS
1.0000 | INHALATION_SPRAY | Freq: Four times a day (QID) | RESPIRATORY_TRACT | 0 refills | Status: AC | PRN
Start: 2022-11-28 — End: ?

## 2022-11-28 MED ORDER — HYDROCHLOROTHIAZIDE 25 MG PO TABS
25.0000 mg | ORAL_TABLET | Freq: Every day | ORAL | 0 refills | Status: AC
Start: 2022-11-28 — End: 2022-12-28

## 2022-11-28 MED ORDER — BENZONATATE 100 MG PO CAPS: 100.0000 mg | ORAL_CAPSULE | Freq: Three times a day (TID) | ORAL | 0 refills | Status: AC

## 2022-11-28 MED ORDER — PREDNISONE 10 MG PO TABS: 40.0000 mg | ORAL_TABLET | Freq: Every day | ORAL | 0 refills | Status: AC

## 2022-11-28 NOTE — ED Provider Notes (Signed)
[place] EMERGENCY DEPARTMENT AT [facility] Provider Note   CSN: [id number] Arrival date & time: 11/28/22  4098     History  Chief Complaint  Patient presents with   Sore Throat   Cough   Chest Pain    Carla Scott is a 47 y.o. female.   Sore Throat Associated symptoms include chest pain.  Cough Associated symptoms: chest pain   Chest Pain Associated symptoms: cough      47 year old female presenting to the emergency department with cold symptoms for the past 3 days.  She feels like she has COVID.  She has known sick contacts with a person leaving work due to similar symptoms.  She endorses nasal congestion, bilateral sore throat, mild nonproductive cough.  Sharp chest discomfort.  No chest pressure.  She is tolerating oral intake.  Afebrile.  Home Medications Prior to Admission medications   Medication Sig Start Date End Date Taking? Authorizing Provider  albuterol (VENTOLIN HFA) 108 (90 Base) MCG/ACT inhaler Inhale 1-2 puffs into the lungs every 6 (six) hours as needed for wheezing or shortness of breath. 11/28/22  Yes Ernie Avena, MD  benzonatate (TESSALON) 100 MG capsule Take 1 capsule (100 mg total) by mouth every 8 (eight) hours. 11/28/22  Yes Ernie Avena, MD  predniSONE (DELTASONE) 10 MG tablet Take 4 tablets (40 mg total) by mouth daily for 5 days. 11/28/22 12/03/22 Yes Ernie Avena, MD  acetaminophen (TYLENOL) 325 MG tablet Take 2 tablets (650 mg total) by mouth every 6 (six) hours as needed for mild pain, fever or headache (or temp > 100). 11/24/20   Juliet Rude, PA-C  hydrochlorothiazide (HYDRODIURIL) 25 MG tablet Take 1 tablet (25 mg total) by mouth daily. 11/28/22 12/28/22  Ernie Avena, MD  ibuprofen (ADVIL) 200 MG tablet Take 200 mg by mouth every 6 (six) hours as needed.    [provider]  nicotine (NICODERM CQ) 21 mg/24hr patch Place 1 patch (21 mg total) onto the skin daily. 05/03/21   Rema Fendt, NP  nicotine polacrilex  (NICORETTE) 2 MG gum Chew 1 piece of gum every 1 to 2 hours (maximum: 24 pieces/day). 05/03/21   Rema Fendt, NP  oxyCODONE (ROXICODONE) 5 MG immediate release tablet Take 1 tablet (5 mg total) by mouth every 6 (six) hours as needed for up to 6 doses for severe pain. 09/14/21   Franne Forts, DO      Allergies    Other and Wheat    Review of Systems   Review of Systems  Respiratory:  Positive for cough.   Cardiovascular:  Positive for chest pain.  All other systems reviewed and are negative.   Physical Exam Updated Vital Signs BP (!) 161/120 (BP Location: Right Arm)   Pulse 90   Temp 98.8 F (37.1 C) (Oral)   Resp 18   Ht 5\' 6"  (1.676 m)   Wt 83.9 kg   LMP 11/09/2022   SpO2 100%   BMI 29.86 kg/m  Physical Exam Vitals and nursing note reviewed.  Constitutional:      General: She is not in acute distress.    Appearance: She is well-developed.  HENT:     Head: Normocephalic and atraumatic.     Right Ear: Tympanic membrane normal.     Left Ear: Tympanic membrane normal.     Mouth/Throat:     Pharynx: Posterior oropharyngeal erythema present. No oropharyngeal exudate.  Eyes:     Conjunctiva/sclera: Conjunctivae normal.  Cardiovascular:  Rate and Rhythm: Normal rate and regular rhythm.     Heart sounds: No murmur heard. Pulmonary:     Effort: Pulmonary effort is normal. No respiratory distress.     Breath sounds: Normal breath sounds.  Abdominal:     Palpations: Abdomen is soft.     Tenderness: There is no abdominal tenderness.  Musculoskeletal:        General: No swelling.     Cervical back: Neck supple.  Skin:    General: Skin is warm and dry.     Capillary Refill: Capillary refill takes less than 2 seconds.  Neurological:     Mental Status: She is alert.  Psychiatric:        Mood and Affect: Mood normal.     ED Results / Procedures / Treatments   Labs (all labs ordered are listed, but only abnormal results are displayed) Labs Reviewed  BASIC  METABOLIC PANEL - Abnormal; Notable for the following components:      Result Value   CO2 21 (*)    All other components within normal limits  CBC - Abnormal; Notable for the following components:   WBC 10.9 (*)    RBC 3.75 (*)    Hemoglobin 11.7 (*)    HCT 34.7 (*)    All other components within normal limits  SARS CORONAVIRUS 2 BY RT PCR  PREGNANCY, URINE  TROPONIN I (HIGH SENSITIVITY)    EKG EKG Interpretation Date/Time:  Tuesday November 28 2022 09:11:57 EDT Ventricular Rate:  82 PR Interval:  190 QRS Duration:  88 QT Interval:  364 QTC Calculation: 425 R Axis:   79  Text Interpretation: Normal sinus rhythm with sinus arrhythmia Normal ECG When compared with ECG of 27-Nov-2019 12:06, No significant change was found Confirmed by Ernie Avena ([id number]) on 11/28/2022 9:33:48 AM  Radiology DG Chest 2 View  Result Date: 11/28/2022 CLINICAL DATA:  Chest pain.  Cold symptoms for 3 days. EXAM: CHEST - 2 VIEW COMPARISON:  X-ray 11/27/2019 FINDINGS: The heart size and mediastinal contours are within normal limits. No consolidation, pneumothorax or effusion. No edema. The visualized skeletal structures are unremarkable. IMPRESSION: No acute cardiopulmonary disease Electronically Signed   By: Karen Kays M.D.   On: 11/28/2022 10:15    Procedures Procedures    Medications Ordered in ED Medications - No data to display  ED Course/ Medical Decision Making/ A&P                                 Medical Decision Making Amount and/or Complexity of Data Reviewed Labs: ordered. Radiology: ordered.  Risk Prescription drug management.    47 year old female presenting to the emergency department with cold symptoms for the past 3 days.  She feels like she has COVID.  She has known sick contacts with a person leaving work due to similar symptoms.  She endorses nasal congestion, bilateral sore throat, mild nonproductive cough.  Sharp chest discomfort.  No chest pressure.  She is tolerating  oral intake.  Afebrile.  Carla Scott is a 47 y.o. female who presents to the ED with a 3 day history of fever, rhinorrhea, and nasal congestion.  On my exam, the patient is well-appearing and well-hydrated.  The patient's lungs are clear to auscultation bilaterally. Additionally, the patient has a soft/non-tender abdomen, clear tympanic membranes, and no oropharyngeal exudates.  There are no signs of meningismus.  I see no signs of an  acute bacterial infection.  The patient's presentation is most consistent with a viral upper respiratory infection.  I have a low suspicion for pneumonia as the patient's cough has been non-productive and the patient is neither tachypneic nor hypoxic on room air.  Additionally, the patient is CTAB.  Patient with pleuritic discomfort, PERC negative, low concern for PE.  Urine pregnancy negative on labs, CBC with a nonspecific leukocytosis to 10.9, mild anemia to 11.7, COVID-19 PCR testing collected and negative, initial troponin 2.  Suspect likely bronchitis.  Chest x-ray was performed which revealed no evidence of actual pneumonia.  Suspect likely viral upper respiratory infection and acute bronchitis.  Will treat with a course of prednisone, Tessalon and albuterol as needed.  I discussed symptomatic management, including hydration, motrin, and tylenol. The patient felt safe being discharged from the ED.  They agreed to followup with the PCP if needed.  I provided ED return precautions.    Final Clinical Impression(s) / ED Diagnoses Final diagnoses:  Viral URI with cough  Bronchitis    Rx / DC Orders ED Discharge Orders          Ordered    predniSONE (DELTASONE) 10 MG tablet  Daily        11/28/22 1008    benzonatate (TESSALON) 100 MG capsule  Every 8 hours        11/28/22 1008    albuterol (VENTOLIN HFA) 108 (90 Base) MCG/ACT inhaler  Every 6 hours PRN        11/28/22 1008    hydrochlorothiazide (HYDRODIURIL) 25 MG tablet  Daily        11/28/22 1026               Ernie Avena, MD 11/29/22 [phone]

## 2022-11-28 NOTE — ED Notes (Signed)
Pt discharged to home using teachback Method. Discharge instructions have been discussed with patient and/or family members. Pt verbally acknowledges understanding d/c instructions, has been given opportunity for questions to be answered, and endorses comprehension to checkout at registration before leaving.  

## 2022-11-28 NOTE — ED Triage Notes (Signed)
Pt to ED c/o cold symptoms, x 3 days. Reports feels how she did when she had COVID previously . Reports known sick contacts

## 2022-11-28 NOTE — Discharge Instructions (Addendum)
Your COVID testing was negative.  Your labs were overall reassuring and your chest x-ray showed no evidence of pneumonia.  Your symptoms are consistent with a likely viral bronchitis.  Recommend continued oral rehydration, Tylenol and ibuprofen for pain control and fever control.  Return for any significant worsening of symptoms.

## 2023-02-03 ENCOUNTER — Emergency Department (HOSPITAL_BASED_OUTPATIENT_CLINIC_OR_DEPARTMENT_OTHER)
Admission: EM | Admit: 2023-02-03 | Discharge: 2023-02-03 | Disposition: A | Discharge status: Home / Self Care | Payer: Self-pay | Attending: Emergency Medicine | Admitting: Emergency Medicine

## 2023-02-03 ENCOUNTER — Encounter (HOSPITAL_BASED_OUTPATIENT_CLINIC_OR_DEPARTMENT_OTHER): Payer: Self-pay

## 2023-02-03 ENCOUNTER — Emergency Department (HOSPITAL_BASED_OUTPATIENT_CLINIC_OR_DEPARTMENT_OTHER): Payer: Self-pay

## 2023-02-03 ENCOUNTER — Other Ambulatory Visit: Payer: Self-pay

## 2023-02-03 DIAGNOSIS — K5732 Diverticulitis of large intestine without perforation or abscess without bleeding: Secondary | ICD-10-CM | POA: Insufficient documentation

## 2023-02-03 DIAGNOSIS — K5792 Diverticulitis of intestine, part unspecified, without perforation or abscess without bleeding: Secondary | ICD-10-CM

## 2023-02-03 LAB — URINALYSIS, ROUTINE W REFLEX MICROSCOPIC
Bacteria, UA: NONE SEEN
Bilirubin Urine: NEGATIVE
Glucose, UA: NEGATIVE mg/dL
Ketones, ur: NEGATIVE mg/dL
Leukocytes,Ua: NEGATIVE
Nitrite: NEGATIVE
Specific Gravity, Urine: 1.029 (ref 1.005–1.030)
pH: 5.5 (ref 5.0–8.0)

## 2023-02-03 LAB — CBC
HCT: 35.6 % — ABNORMAL LOW (ref 36.0–46.0)
Hemoglobin: 11.8 g/dL — ABNORMAL LOW (ref 12.0–15.0)
MCH: 29.8 pg (ref 26.0–34.0)
MCHC: 33.1 g/dL (ref 30.0–36.0)
MCV: 89.9 fL (ref 80.0–100.0)
Platelets: 236 10*3/uL (ref 150–400)
RBC: 3.96 MIL/uL (ref 3.87–5.11)
RDW: 13.5 % (ref 11.5–15.5)
WBC: 14.4 10*3/uL — ABNORMAL HIGH (ref 4.0–10.5)
nRBC: 0 % (ref 0.0–0.2)

## 2023-02-03 LAB — LIPASE, BLOOD: Lipase: 26 U/L (ref 11–51)

## 2023-02-03 LAB — COMPREHENSIVE METABOLIC PANEL
ALT: 7 U/L (ref 0–44)
AST: 10 U/L — ABNORMAL LOW (ref 15–41)
Albumin: 4.2 g/dL (ref 3.5–5.0)
Alkaline Phosphatase: 42 U/L (ref 38–126)
Anion gap: 9 (ref 5–15)
BUN: 8 mg/dL (ref 6–20)
CO2: 23 mmol/L (ref 22–32)
Calcium: 9.5 mg/dL (ref 8.9–10.3)
Chloride: 105 mmol/L (ref 98–111)
Creatinine, Ser: 0.96 mg/dL (ref 0.44–1.00)
GFR, Estimated: >60 mL/min (ref >60)
Glucose, Bld: 108 mg/dL — ABNORMAL HIGH (ref 70–99)
Potassium: 3.5 mmol/L (ref 3.5–5.1)
Sodium: 137 mmol/L (ref 135–145)
Total Bilirubin: 0.6 mg/dL (ref <1.2)
Total Protein: 7.8 g/dL (ref 6.5–8.1)

## 2023-02-03 LAB — PREGNANCY, URINE: Preg Test, Ur: NEGATIVE

## 2023-02-03 MED ORDER — IOHEXOL 300 MG/ML  SOLN
100.0000 mL | Freq: Once | INTRAMUSCULAR | Status: AC | PRN
  Administered 2023-02-03: 100 mL via INTRAVENOUS

## 2023-02-03 MED ORDER — HYDROCODONE-ACETAMINOPHEN 5-325 MG PO TABS
2.0000 | ORAL_TABLET | Freq: Once | ORAL | Status: AC
  Administered 2023-02-03: 2 via ORAL
  Filled 2023-02-03: qty 2

## 2023-02-03 MED ORDER — AMOXICILLIN-POT CLAVULANATE 875-125 MG PO TABS: 1.0000 | ORAL_TABLET | Freq: Two times a day (BID) | ORAL | 0 refills | Status: AC

## 2023-02-03 MED ORDER — METRONIDAZOLE 500 MG PO TABS
500.0000 mg | ORAL_TABLET | Freq: Once | ORAL | Status: AC
  Administered 2023-02-03: 500 mg via ORAL
  Filled 2023-02-03: qty 1

## 2023-02-03 MED ORDER — ONDANSETRON 4 MG PO TBDP: ORAL_TABLET | ORAL | 0 refills | Status: AC

## 2023-02-03 MED ORDER — CIPROFLOXACIN HCL 500 MG PO TABS
500.0000 mg | ORAL_TABLET | Freq: Once | ORAL | Status: AC
  Administered 2023-02-03: 500 mg via ORAL
  Filled 2023-02-03: qty 1

## 2023-02-03 MED ORDER — HYDROCODONE-ACETAMINOPHEN 5-325 MG PO TABS
1.0000 | ORAL_TABLET | ORAL | 0 refills | Status: AC | PRN
Start: 2023-02-03 — End: ?

## 2023-02-03 NOTE — ED Provider Notes (Signed)
EMERGENCY DEPARTMENT AT [facility] Provider Note   CSN: [id number] Arrival date & time: 02/03/23  1122     History  Chief Complaint  Patient presents with   Abdominal Pain    Carla Scott is a 47 y.o. female.  Patient is a 47 year old female who presents with abdominal pain.  She says it started about a week ago.  Her pain is to her left lower quadrant.  She denies any known fevers.  She has had some nausea and decreased appetite but no vomiting.  She has had some loose stools but no diarrhea.  She has had prior episodes of diverticulitis.  I did review her chart.  In 2022 she was admitted for diverticulitis with abscess.  In 2023, she had an ED visit for diverticulitis which resolved with Augmentin.  She has not had follow-up with GI given insurance reasons.  But she does have a PCP.       Home Medications Prior to Admission medications   Medication Sig Start Date End Date Taking? Authorizing Provider  amoxicillin-clavulanate (AUGMENTIN) 875-125 MG tablet Take 1 tablet by mouth every 12 (twelve) hours. 02/03/23  Yes Rolan Bucco, MD  HYDROcodone-acetaminophen (NORCO/VICODIN) 5-325 MG tablet Take 1-2 tablets by mouth every 4 (four) hours as needed. 02/03/23  Yes Rolan Bucco, MD  ondansetron (ZOFRAN-ODT) 4 MG disintegrating tablet 4mg  ODT q4 hours prn nausea/vomit 02/03/23  Yes Rolan Bucco, MD  albuterol (VENTOLIN HFA) 108 (90 Base) MCG/ACT inhaler Inhale 1-2 puffs into the lungs every 6 (six) hours as needed for wheezing or shortness of breath. 11/28/22   Ernie Avena, MD  benzonatate (TESSALON) 100 MG capsule Take 1 capsule (100 mg total) by mouth every 8 (eight) hours. 11/28/22   Ernie Avena, MD  hydrochlorothiazide (HYDRODIURIL) 25 MG tablet Take 1 tablet (25 mg total) by mouth daily. 11/28/22 12/28/22  Ernie Avena, MD  ibuprofen (ADVIL) 200 MG tablet Take 200 mg by mouth every 6 (six) hours as needed.    [provider]  nicotine (NICODERM  CQ) 21 mg/24hr patch Place 1 patch (21 mg total) onto the skin daily. 05/03/21   Rema Fendt, NP  nicotine polacrilex (NICORETTE) 2 MG gum Chew 1 piece of gum every 1 to 2 hours (maximum: 24 pieces/day). 05/03/21   Rema Fendt, NP      Allergies    Other and Wheat    Review of Systems   Review of Systems  Constitutional:  Negative for chills, diaphoresis, fatigue and fever.  HENT:  Negative for congestion, rhinorrhea and sneezing.   Eyes: Negative.   Respiratory:  Negative for cough, chest tightness and shortness of breath.   Cardiovascular:  Negative for chest pain and leg swelling.  Gastrointestinal:  Positive for abdominal pain and nausea. Negative for blood in stool, diarrhea and vomiting.  Genitourinary:  Negative for difficulty urinating, flank pain, frequency and hematuria.  Musculoskeletal:  Negative for arthralgias and back pain.  Skin:  Negative for rash.  Neurological:  Negative for dizziness, speech difficulty, weakness, numbness and headaches.    Physical Exam Updated Vital Signs BP (!) 157/101 (BP Location: Right Arm)   Pulse 76   Temp 99.1 F (37.3 C) (Oral)   Resp 17   Ht 5\' 6"  (1.676 m)   Wt 85.7 kg   LMP 01/29/2023 (Exact Date)   SpO2 100%   BMI 30.51 kg/m  Physical Exam Constitutional:      Appearance: She is well-developed.  HENT:  Head: Normocephalic and atraumatic.  Eyes:     Pupils: Pupils are equal, round, and reactive to light.  Cardiovascular:     Rate and Rhythm: Normal rate and regular rhythm.     Heart sounds: Normal heart sounds.  Pulmonary:     Effort: Pulmonary effort is normal. No respiratory distress.     Breath sounds: Normal breath sounds. No wheezing or rales.  Chest:     Chest wall: No tenderness.  Abdominal:     General: Bowel sounds are normal.     Palpations: Abdomen is soft.     Tenderness: There is abdominal tenderness in the left lower quadrant. There is no guarding or rebound.  Musculoskeletal:        General:  Normal range of motion.     Cervical back: Normal range of motion and neck supple.  Lymphadenopathy:     Cervical: No cervical adenopathy.  Skin:    General: Skin is warm and dry.     Findings: No rash.  Neurological:     Mental Status: She is alert and oriented to person, place, and time.     ED Results / Procedures / Treatments   Labs (all labs ordered are listed, but only abnormal results are displayed) Labs Reviewed  COMPREHENSIVE METABOLIC PANEL - Abnormal; Notable for the following components:      Result Value   Glucose, Bld 108 (*)    AST 10 (*)    All other components within normal limits  CBC - Abnormal; Notable for the following components:   WBC 14.4 (*)    Hemoglobin 11.8 (*)    HCT 35.6 (*)    All other components within normal limits  URINALYSIS, ROUTINE W REFLEX MICROSCOPIC - Abnormal; Notable for the following components:   APPearance HAZY (*)    Hgb urine dipstick MODERATE (*)    Protein, ur TRACE (*)    All other components within normal limits  LIPASE, BLOOD  PREGNANCY, URINE    EKG None  Radiology CT ABDOMEN PELVIS W CONTRAST  Result Date: 02/03/2023 CLINICAL DATA:  Left lower quadrant abdominal pain for 3 days, history of diverticulitis 3 years ago EXAM: CT ABDOMEN AND PELVIS WITH CONTRAST TECHNIQUE: Multidetector CT imaging of the abdomen and pelvis was performed using the standard protocol following bolus administration of intravenous contrast. RADIATION DOSE REDUCTION: This exam was performed according to the departmental dose-optimization program which includes automated exposure control, adjustment of the mA and/or kV according to patient size and/or use of iterative reconstruction technique. CONTRAST:  OMNIPAQUE IOHEXOL 300 MG/ML  SOLN COMPARISON:  09/14/2021 FINDINGS: Lower chest: No acute abnormality. Hepatobiliary: No solid liver abnormality is seen. No gallstones, gallbladder wall thickening, or biliary dilatation. Pancreas: Unremarkable.  No pancreatic ductal dilatation or surrounding inflammatory changes. Spleen: Normal in size without significant abnormality. Adrenals/Urinary Tract: Adrenal glands are unremarkable. Kidneys are normal, without renal calculi, solid lesion, or hydronephrosis. Bladder is unremarkable. Stomach/Bowel: Stomach is within normal limits. Appendix appears normal. Descending colonic diverticulosis. Inflammatory fat stranding and fluid about the proximal to mid descending colon (series 2, image 67). Vascular/Lymphatic: Aortic atherosclerosis. No enlarged abdominal or pelvic lymph nodes. Reproductive: No mass or other significant abnormality. Simple, benign small bilateral ovarian follicles, for which no further follow-up or characterization is required Other: No abdominal wall hernia. Superficial inclusion cyst overlying the right hip, benign, requiring no further follow-up or characterization (series 2, image 79). No ascites. Musculoskeletal: No acute or significant osseous findings. IMPRESSION: Descending colonic  diverticulosis. Inflammatory fat stranding and fluid about the proximal to mid descending colon, consistent with acute diverticulitis. No evidence of perforation or abscess. Aortic Atherosclerosis (ICD10-I70.0). Electronically Signed   By: Jearld Lesch M.D.   On: 02/03/2023 14:39    Procedures Procedures    Medications Ordered in ED Medications  HYDROcodone-acetaminophen (NORCO/VICODIN) 5-325 MG per tablet 2 tablet (has no administration in time range)  metroNIDAZOLE (FLAGYL) tablet 500 mg (has no administration in time range)  ciprofloxacin (CIPRO) tablet 500 mg (has no administration in time range)  iohexol (OMNIPAQUE) 300 MG/ML solution 100 mL (100 mLs Intravenous Contrast Given 02/03/23 1409)    ED Course/ Medical Decision Making/ A&P                                 Medical Decision Making Amount and/or Complexity of Data Reviewed Labs: ordered. Radiology: ordered.  Risk Prescription drug  management.   Patient is a 47 year old female who presents with abdominal pain.  Labs reviewed.  She has a mild elevation in WBC count.  CT scan shows evidence of diverticulitis.  There is no perforation or abscess.  She is otherwise well-appearing.  Will start her on Augmentin.  She was also given a prescription for small amount of hydrocodone and Zofran.  She was encouraged to follow-up with her PCP.  Return precautions were given.  Final Clinical Impression(s) / ED Diagnoses Final diagnoses:  Diverticulitis    Rx / DC Orders ED Discharge Orders          Ordered    amoxicillin-clavulanate (AUGMENTIN) 875-125 MG tablet  Every 12 hours        02/03/23 1534    HYDROcodone-acetaminophen (NORCO/VICODIN) 5-325 MG tablet  Every 4 hours PRN        02/03/23 1534    ondansetron (ZOFRAN-ODT) 4 MG disintegrating tablet        02/03/23 1534              Rolan Bucco, MD 02/03/23 1545

## 2023-02-03 NOTE — ED Triage Notes (Signed)
She c/o llq area abd. Pain s 3 days. She recognizes this as probable diverticulitis, for which she was hospitalized ~ 2 years ago. She denies fever, and is ambulatory.

## 2023-04-12 IMAGING — CT CT ABD-PELV W/ CM
2 of 5 series · 16 of 46 positions shown, 18 images · IV contrast (agent unspecified)
Comparison: 11/21/2020

CLINICAL DATA: Left lower quadrant abdominal pain.

EXAM:
CT ABDOMEN AND PELVIS WITH CONTRAST
TECHNIQUE: Multidetector CT imaging of the abdomen and pelvis was performed
using the standard protocol following bolus administration of
intravenous contrast.

[Series 3: a/p w/ 5mm · axial · 0.82mm/px · z∈[+900,+1295]mm · 13 of 89 slices shown, 15 images]
[im 5/89  soft-tissue]
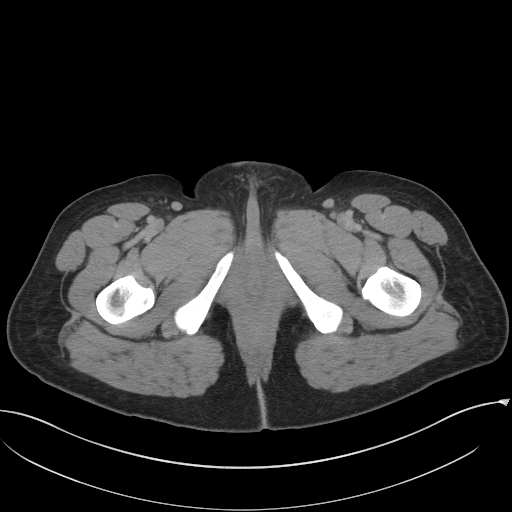
[im 5/89  bone]
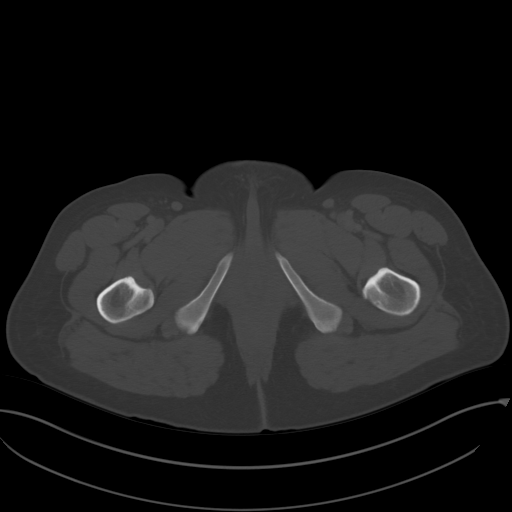
[im 14/89  soft-tissue]
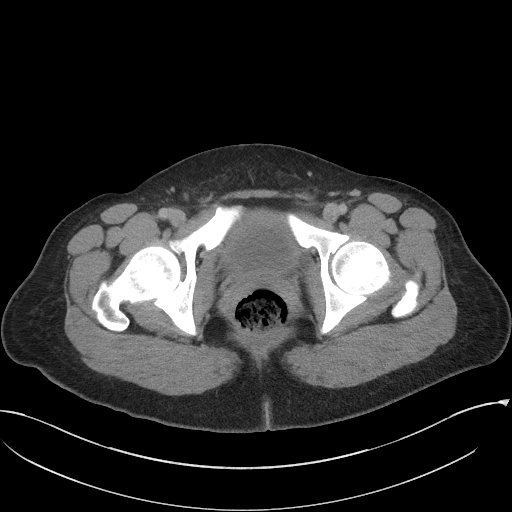
[im 18/89  soft-tissue]
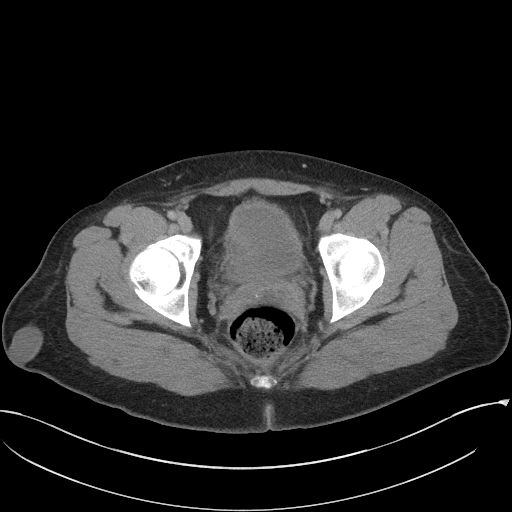
[im 27/89  soft-tissue]
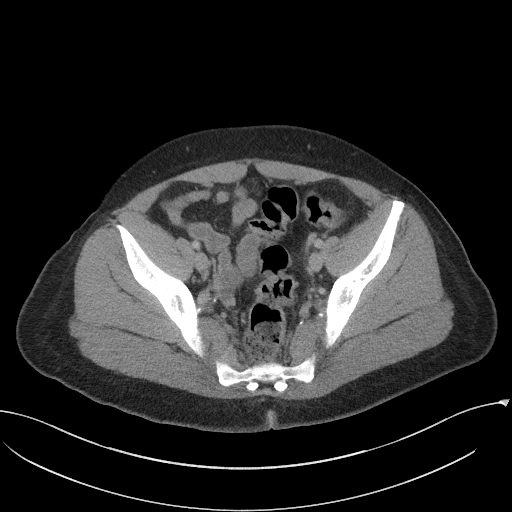
[im 31/89  soft-tissue]
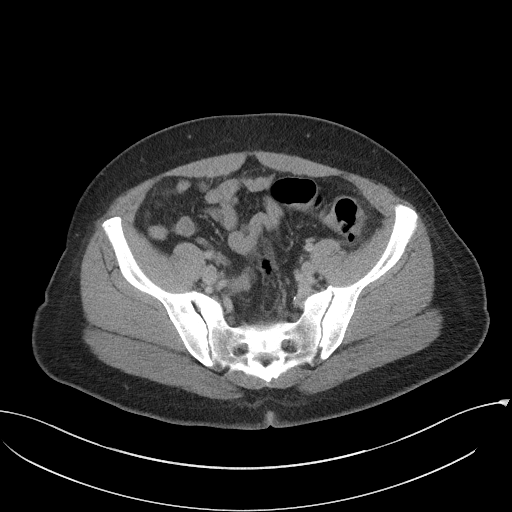
[im 40/89  soft-tissue]
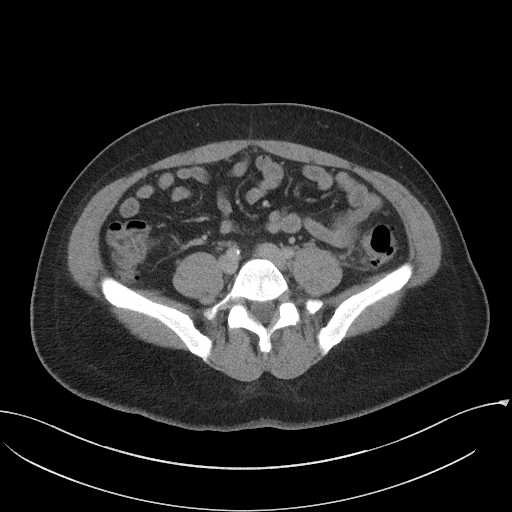
[im 45/89  soft-tissue]
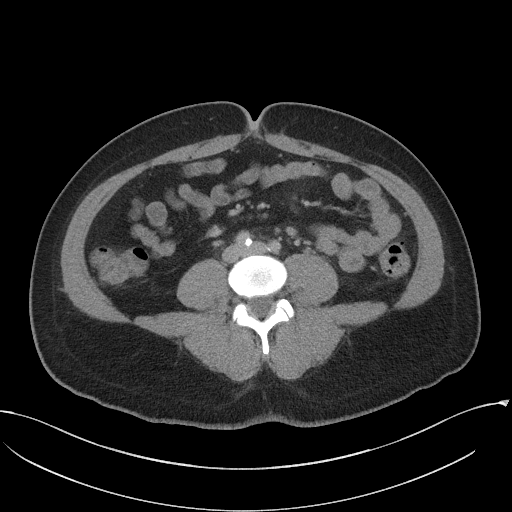
[im 49/89  soft-tissue]
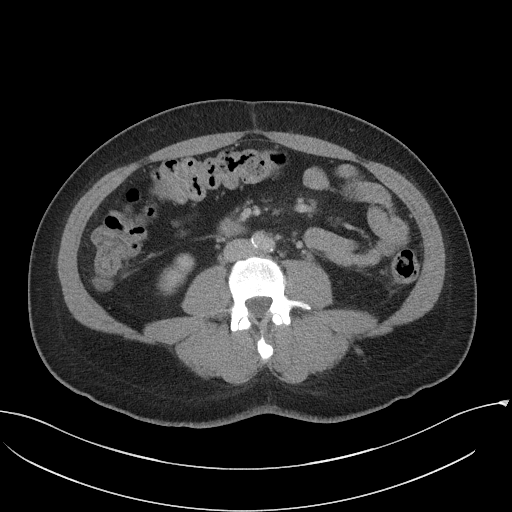
[im 58/89  soft-tissue]
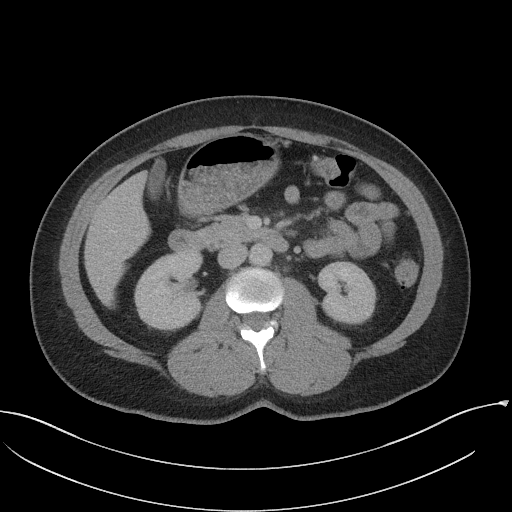
[im 58/89  bone]
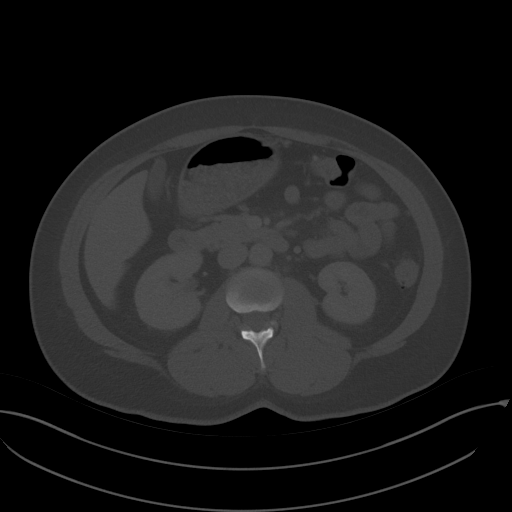
[im 62/89  soft-tissue]
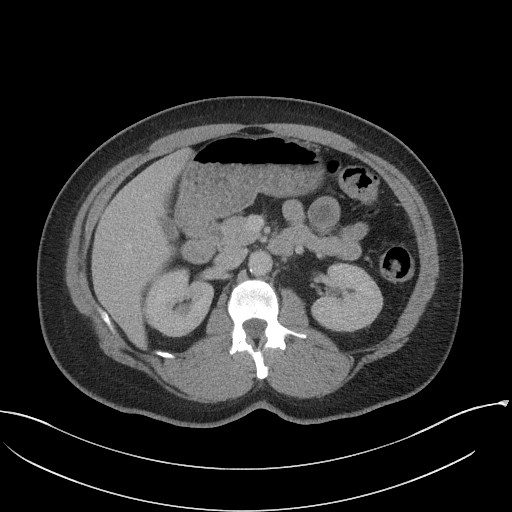
[im 71/89  soft-tissue]
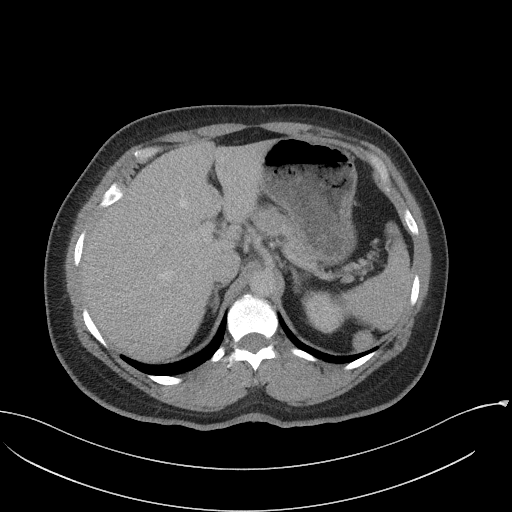
[im 75/89  soft-tissue]
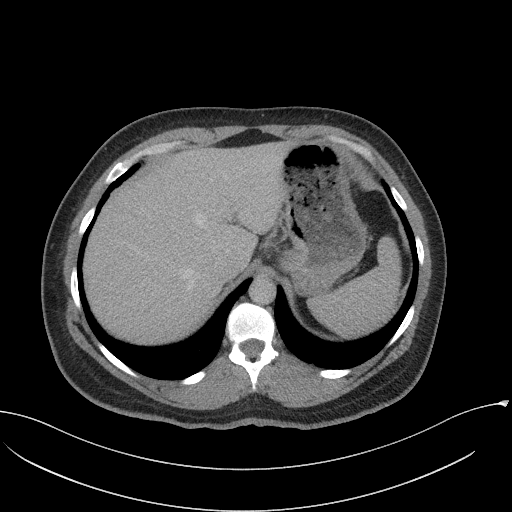
[im 84/89  soft-tissue]
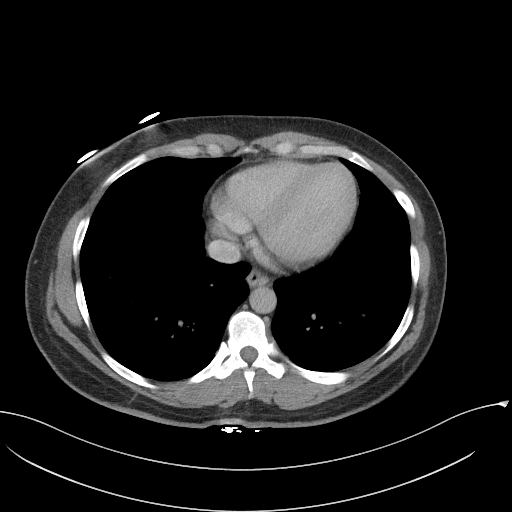

[Series 6: a/p w/ cor · coronal · 0.93mm/px · 3 of 219 slices shown]
[im 73/219  soft-tissue]
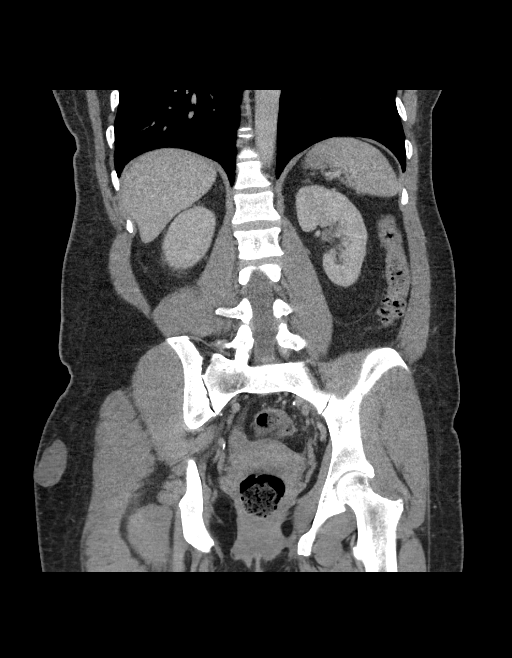
[im 97/219  soft-tissue]
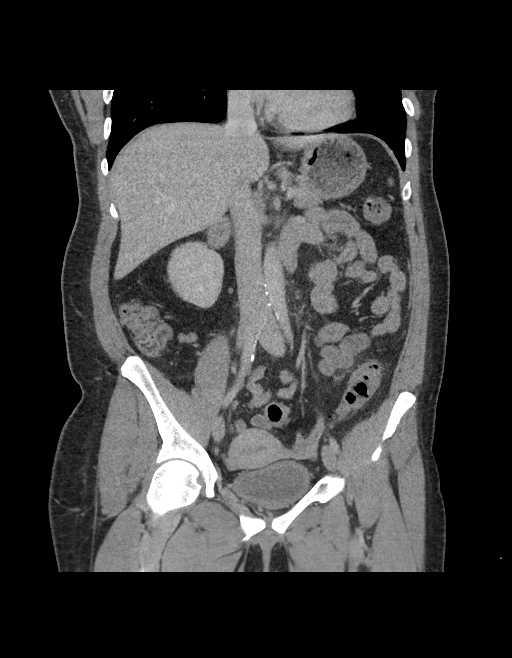
[im 122/219  soft-tissue]
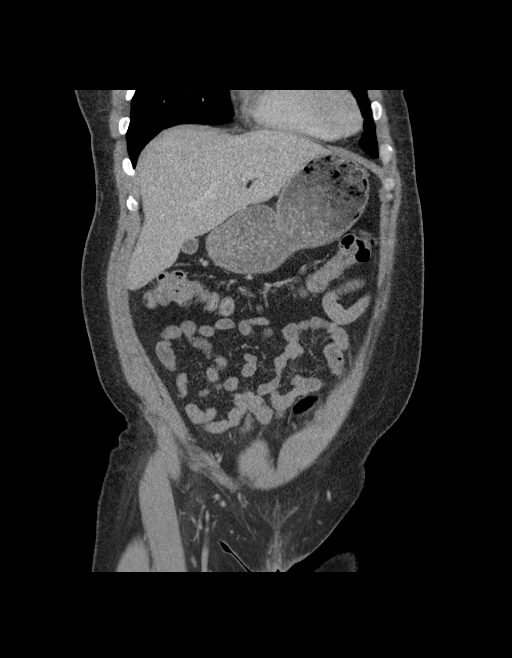

[16 of 46 positions shown; findings below may reference images not displayed]

RADIATION DOSE REDUCTION: This exam was performed according to the
departmental dose-optimization program which includes automated
exposure control, adjustment of the mA and/or kV according to
patient size and/or use of iterative reconstruction technique.

CONTRAST:  75mL OMNIPAQUE IOHEXOL 300 MG/ML  SOLN
FINDINGS: Lower chest: No acute abnormality.

Hepatobiliary: No focal liver abnormality is seen. No gallstones,
gallbladder wall thickening, or biliary dilatation.

Pancreas: Unremarkable. No pancreatic ductal dilatation or
surrounding inflammatory changes.

Spleen: Normal in size without focal abnormality.

Adrenals/Urinary Tract: Normal adrenal glands. No kidney stone,
hydronephrosis or mass. The urinary bladder is unremarkable.

Stomach/Bowel: Stomach appears within normal limits. The appendix is
visualized and appears normal. Colonic diverticulosis is identified.
Focal area of inflammation with colonic wall thickening is noted
involving the distal descending colon/proximal sigmoid colon. Small
amount of free fluid noted is within the adjacent left distal
pericolic gutter.

Vascular/Lymphatic: Aortic atherosclerosis. No aneurysm. No
abdominopelvic adenopathy identified.

Reproductive: The uterus and adnexal structures are unremarkable.

Other: No focal fluid collections identified to suggest abscess. No
signs of pneumoperitoneum.

Musculoskeletal: Well-circumscribed low-attenuation mass within the
subcutaneous soft tissues of the right flank measures 3 cm and is
compatible with a benign epidermal inclusion cyst, image 70/3.
IMPRESSION: 1. Examination is positive for acute diverticulitis involving the
distal descending colon/proximal sigmoid colon. No evidence for
perforation or abscess.
2. Aortic Atherosclerosis ([id number]).

## 2023-05-30 ENCOUNTER — Encounter: Payer: Self-pay | Admitting: Family

## 2023-05-30 ENCOUNTER — Ambulatory Visit: Payer: Self-pay | Admitting: Family

## 2023-05-30 NOTE — Progress Notes (Signed)
 Erroneous encounter-disregard

## 2023-05-30 NOTE — Telephone Encounter (Signed)
  Chief Complaint: abdominal pain no BM in 3 days hx hole in bowels in2022 Symptoms: abdominal pain comes and goes. No BM x 3 days . Normal pattern every am. Rectal pain. Unable to pass gas at times and when tries pain in abdomen noted.  Frequency: this am 3 am  Pertinent Negatives: Patient denies fever no severe pain now. No N/V/D Disposition: [] ED /[] Urgent Care (no appt availability in office) / [x] Appointment(In office/virtual)/ []  [place] [facility]/ [] Home Care/ [] Refused Recommended Disposition /[] [facility]/ []  Follow-up with PCP Additional Notes:   Due to hx scheduled my chart VV with PCP patient has to work today . Recommended drinking warm liquids .       Copied from CRM [id number]. Topic: Clinical - Red Word Triage >> May 30, 2023  8:20 AM Everette C wrote: [facility] that prompted transfer to Nurse Triage: The patient shares that they have been experiencing abdominal discomfort and an inability to use the restroom for roughly three days Reason for Disposition  [1] MODERATE pain (e.g., interferes with normal activities) AND [2] pain comes and goes (cramps) AND [3] present > 24 hours  (Exception: Pain with Vomiting or Diarrhea - see that Guideline.)  Answer Assessment - Initial Assessment Questions 1. LOCATION: "Where does it hurt?"      Pelvic area and rectum 2. RADIATION: "Does the pain shoot anywhere else?" (e.g., chest, back)     Rectal area 3. ONSET: "When did the pain begin?" (e.g., minutes, hours or days ago)      This am 3 am  4. SUDDEN: "Gradual or sudden onset?"     na 5. PATTERN "Does the pain come and go, or is it constant?"    - If it comes and goes: "How long does it last?" "Do you have pain now?"     (Note: Comes and goes means the pain is intermittent. It goes away completely between bouts.)    - If constant: "Is it getting better, staying the same, or getting worse?"      (Note: Constant means the pain never goes away completely; most serious  pain is constant and gets worse.)      Constant  6. SEVERITY: "How bad is the pain?"  (e.g., Scale 1-10; mild, moderate, or severe)    - MILD (1-3): Doesn't interfere with normal activities, abdomen soft and not tender to touch.     - MODERATE (4-7): Interferes with normal activities or awakens from sleep, abdomen tender to touch.     - SEVERE (8-10): Excruciating pain, doubled over, unable to do any normal activities.       5/10 7. RECURRENT SYMPTOM: "Have you ever had this type of stomach pain before?" If Yes, ask: "When was the last time?" and "What happened that time?"      Na  8. CAUSE: "What do you think is causing the stomach pain?"     No BM in 3 days  9. RELIEVING/AGGRAVATING FACTORS: "What makes it better or worse?" (e.g., antacids, bending or twisting motion, bowel movement)     Nothing  10. OTHER SYMPTOMS: "Do you have any other symptoms?" (e.g., back pain, diarrhea, fever, urination pain, vomiting)       Abdominal pain comes and goes. Rectal pain no BM x 3 days  11. PREGNANCY: "Is there any chance you are pregnant?" "When was your last menstrual period?"       na  Protocols used: Abdominal Pain - [facility]

## 2023-06-06 ENCOUNTER — Encounter: Payer: Self-pay | Admitting: Family

## 2023-06-06 NOTE — Progress Notes (Signed)
 Erroneous encounter-disregard

## 2023-06-26 ENCOUNTER — Encounter: Admitting: Family

## 2023-06-26 NOTE — Progress Notes (Signed)
 Erroneous encounter-disregard

## 2023-09-13 IMAGING — CT CT ABD-PELV W/ CM
2 of 4 series · 16 of 46 positions shown, 18 images · IV contrast (APPLIED)
Comparison: 04/13/2021

CLINICAL DATA: BILATERAL lower quadrant and RIGHT flank pain,
history of diverticulitis and prior ovarian torsion

EXAM:
CT ABDOMEN AND PELVIS WITH CONTRAST
TECHNIQUE: Multidetector CT imaging of the abdomen and pelvis was performed
using the standard protocol following bolus administration of
intravenous contrast.

[Series 2: abd pel w · axial · 0.79mm/px · z∈[+653,+1068]mm · 13 of 91 slices shown, 15 images]
[im 4/91  soft-tissue]
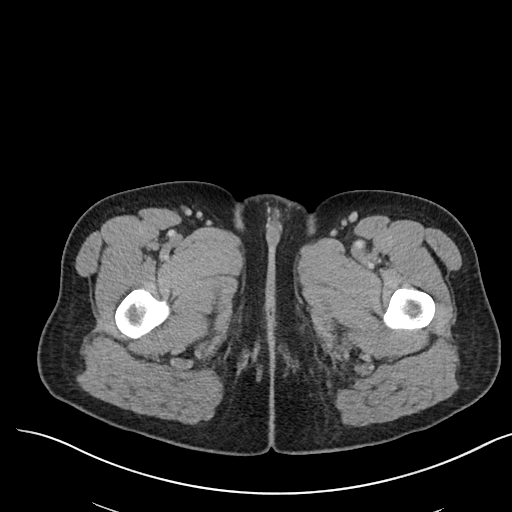
[im 4/91  bone]
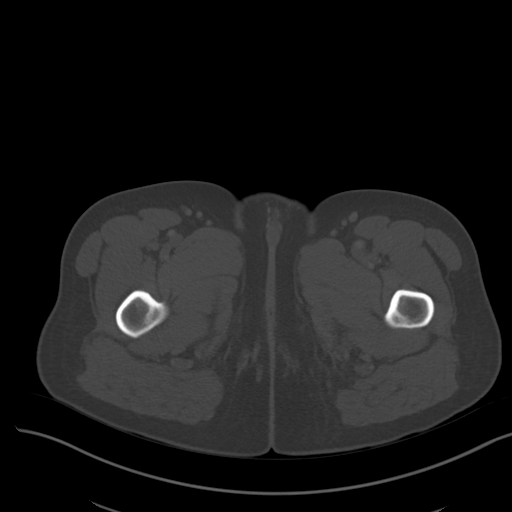
[im 11/91  soft-tissue]
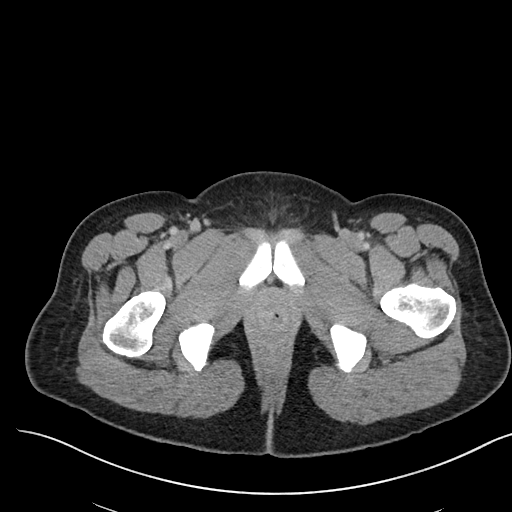
[im 19/91  soft-tissue]
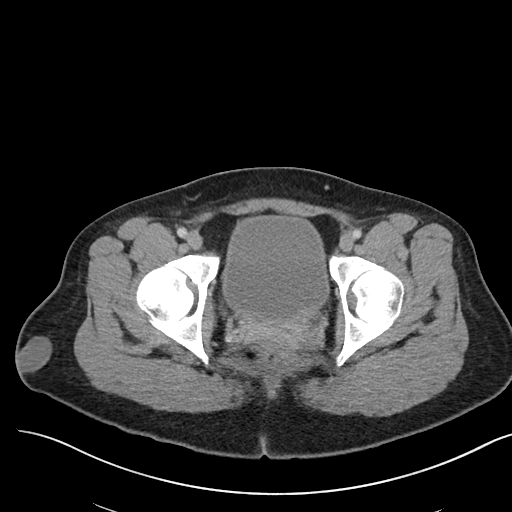
[im 26/91  soft-tissue]
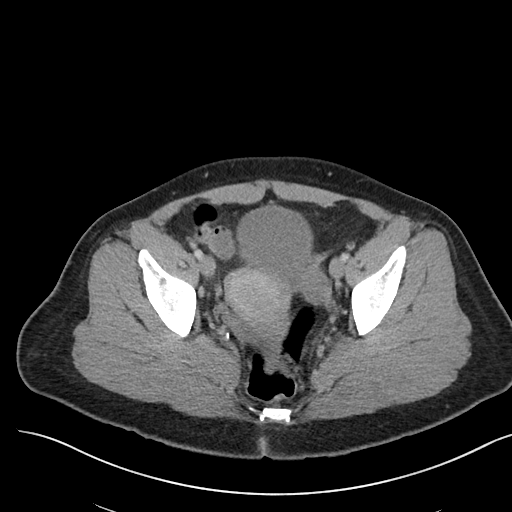
[im 33/91  soft-tissue]
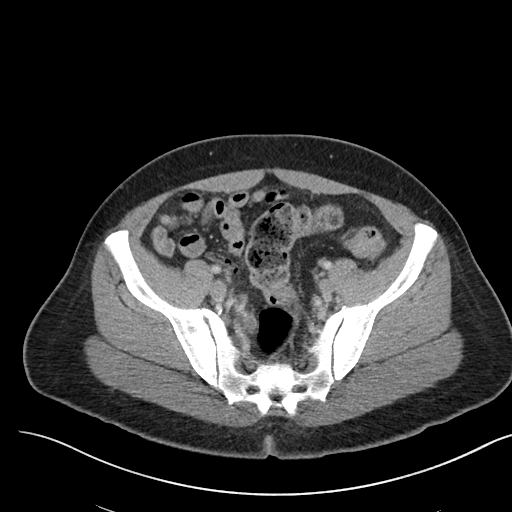
[im 40/91  soft-tissue]
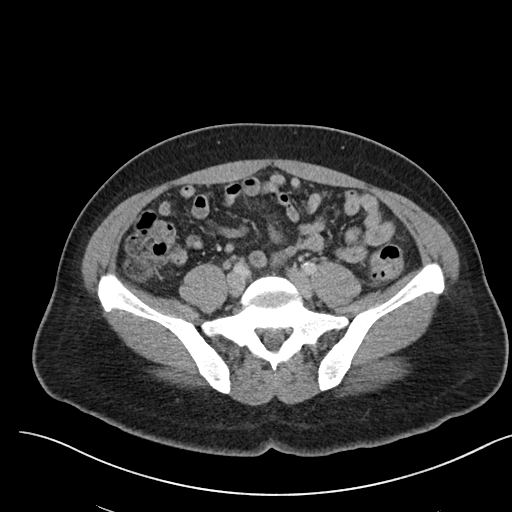
[im 47/91  soft-tissue]
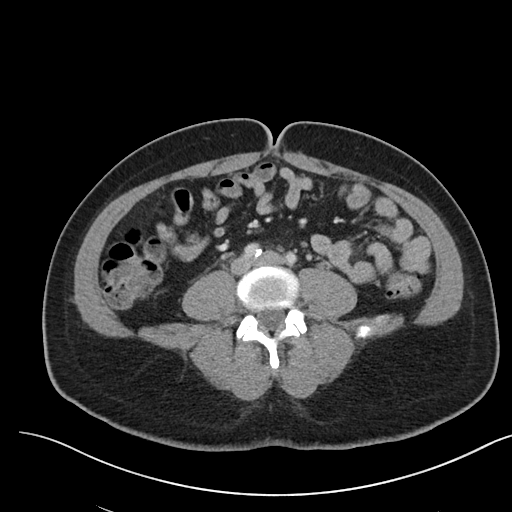
[im 51/91  soft-tissue]
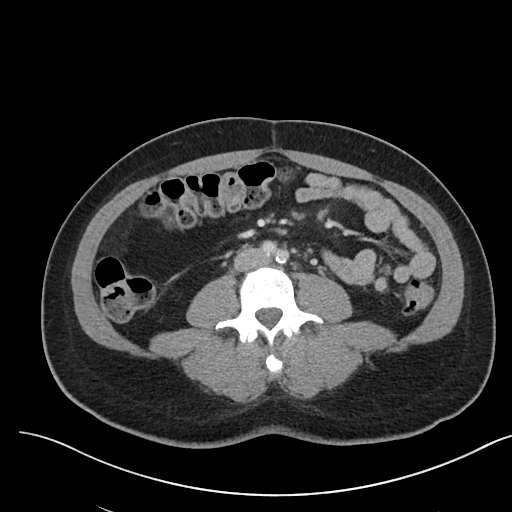
[im 58/91  soft-tissue]
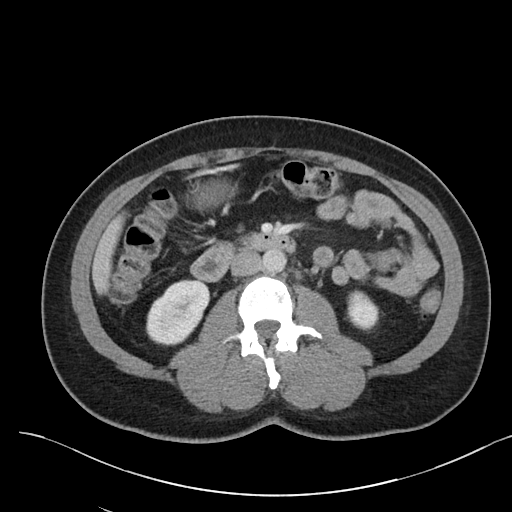
[im 58/91  bone]
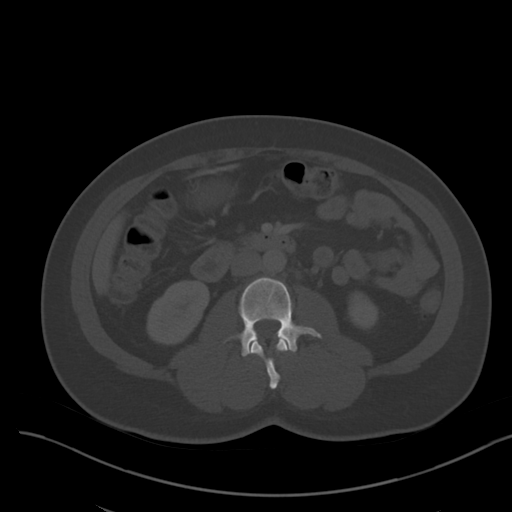
[im 65/91  soft-tissue]
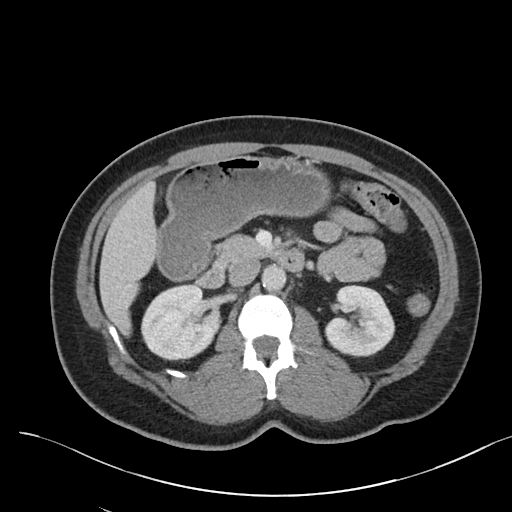
[im 73/91  soft-tissue]
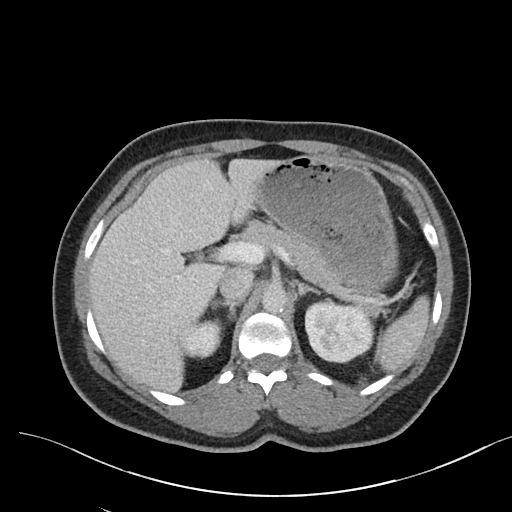
[im 80/91  soft-tissue]
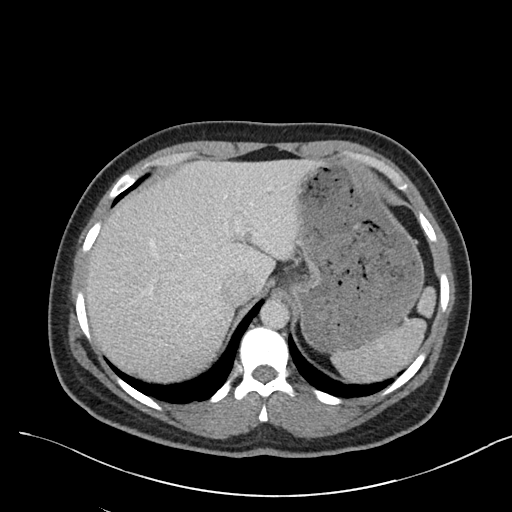
[im 87/91  soft-tissue]
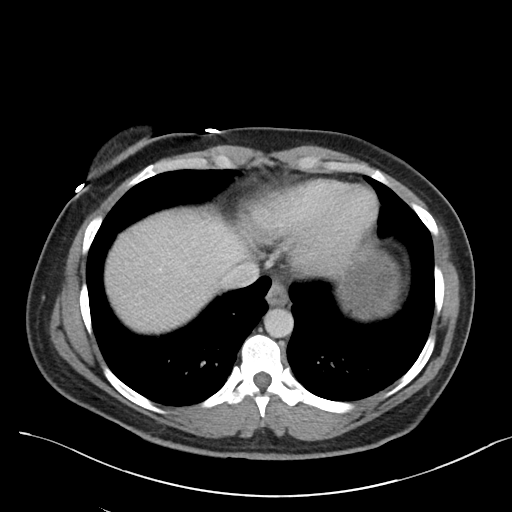

[Series 5: coronal · coronal · 0.81mm/px · 3 of 99 slices shown]
[im 33/99  soft-tissue]
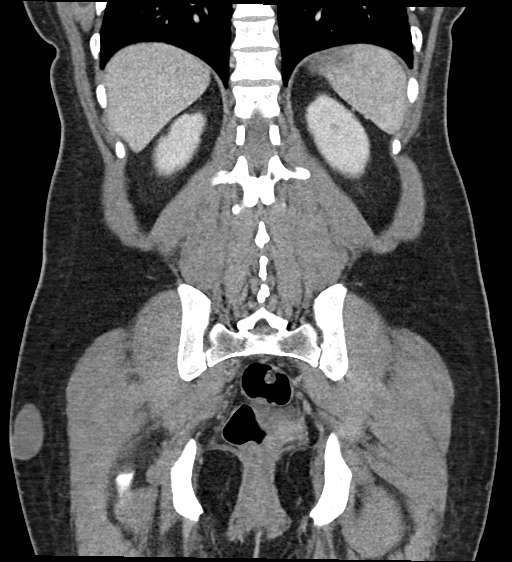
[im 44/99  soft-tissue]
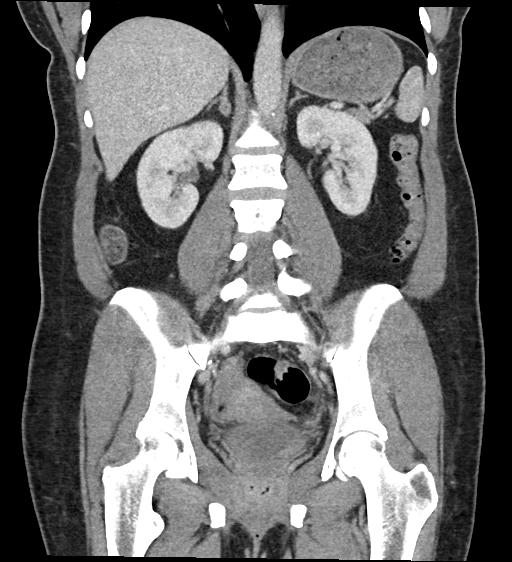
[im 55/99  soft-tissue]
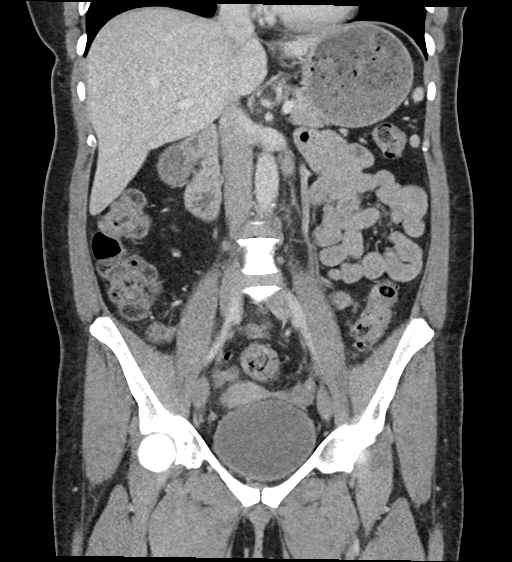

[16 of 46 positions shown; findings below may reference images not displayed]

RADIATION DOSE REDUCTION: This exam was performed according to the
departmental dose-optimization program which includes automated
exposure control, adjustment of the mA and/or kV according to
patient size and/or use of iterative reconstruction technique.

CONTRAST:  80mL OMNIPAQUE IOHEXOL 300 MG/ML SOLN IV. No oral
contrast.
FINDINGS: Lower chest: Minimal dependent bibasilar atelectasis

Hepatobiliary: Contracted gallbladder.  Liver normal appearance.

Pancreas: Normal appearance

Spleen: Normal appearance.  Small splenule.

Adrenals/Urinary Tract: 11 x 8 mm RIGHT adrenal nodule, measuring 64
HU; follow-up adrenal washout CT recommended in 1 year to assess.
Kidneys, ureters, and bladder normal appearance

Stomach/Bowel: Stomach distended by food debris. Normal appendix.
Minimal distal colonic diverticulosis without evidence of
diverticulitis. Large and small bowel loops otherwise unremarkable.

Vascular/Lymphatic: Atherosclerotic calcifications aorta and iliac
arteries without aneurysm. Vascular structures patent. No
adenopathy.

Reproductive: Uterus and ovaries normal appearance

Other: Subcutaneous cystic mass lateral RIGHT pelvis [DATE] x 2.2 cm
unchanged question sebaceous cyst versus epidermal inclusion cyst.
No free air or free fluid. No hernia or inflammatory process.

Musculoskeletal: Unremarkable
IMPRESSION: Minimal distal colonic diverticulosis without evidence of
diverticulitis.

No acute intra-abdominal or intrapelvic abnormalities.

11 x 8 mm RIGHT adrenal nodule; follow-up adrenal washout CT
recommended in 1 year.

Stable subcutaneous cystic mass lateral RIGHT pelvis [DATE] x 2.2 cm
question sebaceous cyst versus epidermal inclusion cyst.

Aortic Atherosclerosis ([id number]).
